# Patient Record
Sex: Male | Born: 1945 | Race: White | Hispanic: No | Marital: Married
Health system: Midwestern US, Community
[De-identification: ages and names within clinical notes are randomized; demographics above are authoritative.]

## PROBLEM LIST (undated history)

## (undated) DIAGNOSIS — Z87438 Personal history of other diseases of male genital organs: Secondary | ICD-10-CM

## (undated) DIAGNOSIS — I480 Paroxysmal atrial fibrillation: Secondary | ICD-10-CM

## (undated) DIAGNOSIS — Z950 Presence of cardiac pacemaker: Secondary | ICD-10-CM

## (undated) DIAGNOSIS — N529 Male erectile dysfunction, unspecified: Secondary | ICD-10-CM

## (undated) DIAGNOSIS — I10 Essential (primary) hypertension: Secondary | ICD-10-CM

## (undated) DIAGNOSIS — I2699 Other pulmonary embolism without acute cor pulmonale: Secondary | ICD-10-CM

## (undated) DIAGNOSIS — Z8669 Personal history of other diseases of the nervous system and sense organs: Secondary | ICD-10-CM

## (undated) DIAGNOSIS — C4490 Unspecified malignant neoplasm of skin, unspecified: Secondary | ICD-10-CM

## (undated) DIAGNOSIS — I4891 Unspecified atrial fibrillation: Secondary | ICD-10-CM

## (undated) DIAGNOSIS — I495 Sick sinus syndrome: Secondary | ICD-10-CM

## (undated) DIAGNOSIS — R7303 Prediabetes: Secondary | ICD-10-CM

## (undated) DIAGNOSIS — M1990 Unspecified osteoarthritis, unspecified site: Secondary | ICD-10-CM

## (undated) DIAGNOSIS — C801 Malignant (primary) neoplasm, unspecified: Secondary | ICD-10-CM

## (undated) DIAGNOSIS — G4730 Sleep apnea, unspecified: Secondary | ICD-10-CM

## (undated) DIAGNOSIS — E7800 Pure hypercholesterolemia, unspecified: Secondary | ICD-10-CM

## (undated) DIAGNOSIS — R0602 Shortness of breath: Secondary | ICD-10-CM

## (undated) DIAGNOSIS — I442 Atrioventricular block, complete: Secondary | ICD-10-CM

## (undated) DIAGNOSIS — H269 Unspecified cataract: Secondary | ICD-10-CM

## (undated) DIAGNOSIS — E785 Hyperlipidemia, unspecified: Secondary | ICD-10-CM

## (undated) DIAGNOSIS — Z8719 Personal history of other diseases of the digestive system: Secondary | ICD-10-CM

## (undated) HISTORY — PX: CATARACT EXTRACTION, BILATERAL: SHX1313

## (undated) HISTORY — PX: COSMETIC SURGERY: SHX468

## (undated) HISTORY — PX: HAND SURGERY: SHX662

## (undated) HISTORY — PX: PROSTATE SURGERY: SHX751

## (undated) HISTORY — PX: NASAL SEPTUM SURGERY: SHX37

## (undated) HISTORY — PX: SKIN CANCER EXCISION: SHX779

## (undated) HISTORY — PX: APPENDECTOMY: SHX54

## (undated) HISTORY — PX: SKIN GRAFT: SHX250

## (undated) HISTORY — PX: VASECTOMY: SHX75

## (undated) HISTORY — PX: HERNIA REPAIR: SHX51

## (undated) HISTORY — PX: PALATE SURGERY: SHX729

## (undated) HISTORY — PX: OSTEOTOMY AND ULNAR SHORTENING: SHX2140

## (undated) HISTORY — PX: KNEE SURGERY: SHX244

---

## 2000-06-09 DIAGNOSIS — I2699 Other pulmonary embolism without acute cor pulmonale: Secondary | ICD-10-CM

## 2000-06-09 HISTORY — DX: Other pulmonary embolism without acute cor pulmonale: I26.99

## 2005-03-20 NOTE — Op Note (Signed)
[facility]                                OPERATION REPORT                          SURGEON:  Alveria Apley, D.O.   [name] [name], [name] [name]   E:   MR  [id number]                         DATE:            03/20/2005   #:   [name]  [id number]                      PT. LOCATION:    [id number]   #   [name], D.O.   cc:    [name], D.O.   PREOPERATIVE DIAGNOSIS:   Tear of posterior horn medial meniscus, left knee.   POSTOPERATIVE DIAGNOSIS:   Same.   OPERATIVE PROCEDURE:   Partial medial meniscectomy, left knee.  CPT code 74259   SURGEON:[name], D.O.   ASSISTANT:   Hoffstaff, CSA   ANESTHESIA:   General.   COMPONENTS:   None.   COMPLICATIONS:   None.   EBL:   Less than 5 cc.   INDICATIONS:  This patient is a pleasant 59 year old male who has undergone   previous left knee arthroscopy and partial meniscectomy for a meniscal   tear.  He presents with a long standing history of left knee pain and was   found to have a second tear of his medial meniscus.  He tried numerous   conservative therapies to relieve the pain in his knee and unfortunately,   none of which provided lasting relief.  After the risks, benefits, and   possible complications of surgical intervention were reviewed, the patient   elected to proceed with partial medial meniscectomy.  It was also noted   that he sustained bilateral pulmonary embolism secondary to a bilateral   hernia repair. Therefore, the plan was to monitor the patient overnight and   start anticoagulation on him the first thing in the morning.   DESCRIPTION OF PROCEDURE:  The patient was seen and identified in the   preoperative holding area.  The procedure was reviewed and all questions   answered.  The left lower extremity was initialed and the patient received   a preoperative dose of IV antibiotics.  He was taken to the operating suite   where he underwent successful general endotracheal intubation by the    department of anesthesia.  The right leg was placed in a well padded well   leg holder while a tourniquet was applied to the left thigh.  The left   lower extremity was placed in the knee positioner and the left leg was then   sterilely prepped and draped in the routine fashion.  Using an Esmarch   bandage, the lower extremity was exsanguinated and the tourniquet inflated   to 350 mmHg.  Starting through the lateral incision, the arthroscope was   inserted and a diagnostic arthroscopy of the knee was performed.  A medial   incision was made through which the probe could be passed and the   patellofemoral articulation as well as the medial and lateral compartments   were examined.  The patellofemoral articulation was well  preserved and the   medial gutter showed no loose bodies.  The medial compartment showed a   small tear of posterior horn of medial meniscus which was mobile to probe   examination.  The anterior cruciate ligament was probe patent and stable.   Examination of the lateral compartment showed the meniscus to be probe   patent, stable, and with no evidence of significant cartilage damage.  The   lateral gutters showed no loose bodies.  The probe was then removed and the   arthroscopic shaver inserted.  The medial meniscal tear was trimmed to a   stable margin and reexamined with the probe to insure stability.  The   instruments were then removed and the knee was closed with 3-0 nylon in an   interrupted fashion.  Sterile dressings were applied to the knee and an Ace   bandage applied from mid shin to mid thigh.  The second thigh-high TED hose   was applied immediately after the surgery and the tourniquet was then   deflated.  The patient was then extubated and transported to the PACU in   stable and satisfactory condition.   Electronically Signed By:   Alveria Apley, D.O. 04/05/2005 20:17   _________________________________   Alveria Apley, D.O.   ecc  D:  03/22/2005  T:  03/23/2005  7:29 A    [id number]

## 2005-03-22 NOTE — Op Note (Signed)
[facility]                                OPERATION REPORT                          SURGEON:  Alveria Apley, D.O.   [name] [name], [name] [name]   E:   MR  [id number]                         DATE:            03/20/2005   #:   [name] [name]  [id number]                      PT. LOCATION:    [id number]   #   [name], D.O.   cc:    [name], D.O.   PREOPERATIVE DIAGNOSIS:   Tear of posterior horn medial meniscus, left knee.   POSTOPERATIVE DIAGNOSIS:   Same.   OPERATIVE PROCEDURE:   Partial medial meniscectomy, left knee.  CPT code 84132   SURGEON:[name], D.O.   ASSISTANT:   Hoffstaff, CSA   ANESTHESIA:   General.   COMPONENTS:   None.   COMPLICATIONS:   None.   EBL:   Less than 5 cc.   INDICATIONS:  This patient is a pleasant 59 year old male who has undergone   previous left knee arthroscopy and partial meniscectomy for a meniscal   tear.  He presents with a long standing history of left knee pain and was   found to have a second tear of his medial meniscus.  He tried numerous   conservative therapies to relieve the pain in his knee and unfortunately,   none of which provided lasting relief.  After the risks, benefits, and   possible complications of surgical intervention were reviewed, the patient   elected to proceed with partial medial meniscectomy.  It was also noted   that he sustained bilateral pulmonary embolism secondary to a bilateral   hernia repair. Therefore, the plan was to monitor the patient overnight and   start anticoagulation on him the first thing in the morning.   DESCRIPTION OF PROCEDURE:  The patient was seen and identified in the   preoperative holding area.  The procedure was reviewed and all questions   answered.  The left lower extremity was initialed and the patient received   a preoperative dose of IV antibiotics.  He was taken to the operating suite   where he underwent successful general endotracheal intubation by the   department of anesthesia.  The right leg was placed  in a well padded well   leg holder while a tourniquet was applied to the left thigh.  The left   lower extremity was placed in the knee positioner and the left leg was then   sterilely prepped and draped in the routine fashion.  Using an Esmarch   bandage, the lower extremity was exsanguinated and the tourniquet inflated   to 350 mmHg.  Starting through the lateral incision, the arthroscope was   inserted and a diagnostic arthroscopy of the knee was performed.  A medial   incision was made through which the probe could be passed and the   patellofemoral articulation as well as the medial and lateral compartments   were examined.  The patellofemoral articulation was well  preserved and the   medial gutter showed no loose bodies.  The medial compartment showed a   small tear of posterior horn of medial meniscus which was mobile to probe   examination.  The anterior cruciate ligament was probe patent and stable.   Examination of the lateral compartment showed the meniscus to be probe   patent, stable, and with no evidence of significant cartilage damage.  The   lateral gutters showed no loose bodies.  The probe was then removed and the   arthroscopic shaver inserted.  The medial meniscal tear was trimmed to a   stable margin and reexamined with the probe to insure stability.  The   instruments were then removed and the knee was closed with 3-0 nylon in an   interrupted fashion.  Sterile dressings were applied to the knee and an Ace   bandage applied from mid shin to mid thigh.  The second thigh-high TED hose   was applied immediately after the surgery and the tourniquet was then   deflated.  The patient was then extubated and transported to the PACU in   stable and satisfactory condition.   Electronically Signed By:   Alveria Apley, D.O. 04/05/2005 20:17   _________________________________   Alveria Apley, D.O.   ecc  D:  03/22/2005  T:  03/23/2005  7:29 A   [id number]

## 2007-09-06 NOTE — Procedures (Signed)
Test Reason : PRE OP   Blood Pressure : ***/*** mmHG   Vent. Rate : 068 BPM     Atrial Rate : 068 BPM   P-R Int : 156 ms          QRS Dur : 090 ms   QT Int : 388 ms       P-R-T Axes : 063 049 049 degrees   QTc Int : 412 ms   Normal sinus rhythm   Normal ECG   When compared with ECG of 16 Mar 2005   No significant change was found   Confirmed by Dewitt Rota, M.D., Ramanaiah ([id number]) on 09/07/2007 8:39:30 AM   Referred By:  Val Eagle           Overread By: Laurena Spies, M.D.

## 2007-09-06 NOTE — Procedures (Signed)
Test Reason : PRE OP   Blood Pressure : ***/*** mmHG   Vent. Rate : 068 BPM     Atrial Rate : 068 BPM   P-R Int : 156 ms          QRS Dur : 090 ms   QT Int : 388 ms       P-R-T Axes : 063 049 049 degrees   QTc Int : 412 ms   Normal sinus rhythm   Normal ECG   When compared with ECG of 16 Mar 2005   No significant change was found   Confirmed by Kakani, M.D., Ramanaiah ([id number]) on 09/07/2007 8:39:30 AM   Referred By:  DOCTOR UNASSIGN           Overread By: Ramanaiah Kakani, M.D.

## 2007-09-20 NOTE — Op Note (Signed)
[facility]                                OPERATION REPORT                        SURGEON:  JEFFREY Neoma Laming, M.D.   [facility] Venturino, Nilo   E:   MR  [id number]                DATE OF SURGERY:                     09/20/2007   #:   [name] [name]  [id number]             PT. LOCATION:                        OR  [id number]   #   JEFFREY Neoma Laming, M.D.      DOB: [birth year]        AGE:62        SEX:  M   cc:    Ocie Cornfield, M.D.          JEFFREY Neoma Laming, M.D.          Belinda Fisher, M.D.   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   OPERATIONS PERFORMED:   1. Nasal septoplasty.   2. Reinforcement of right mucoperichondrial flap with right inferior       turbinate mucosal graft.   3. Bilateral partial resection of inferior turbinates.   SURGEON:   Youlanda Roys, M.D.   SURGICAL ASSISTANT:   Gae Gallop, R.N.   ANESTHESIA:   General endotracheal anesthesia by Dr. Nestor Lewandowsky supplemented with topical   Afrin on cottonoids and 2% lidocaine with 1:100,000 epinephrine (8 cc)   ESTIMATED BLOOD LOSS:   Minimal   PACKING:   Bilateral Doyle splints, bilateral MeroGel, bilateral Bactroban ointment,   bilateral NuKnit Surgicel and bilateral Rapid Rhino packs.   COMPLICATIONS:   None   INDICATIONS FOR SURGERY: This is a pleasant 62 year old male who was   referred to [facility] for a   complaint of sleep apnea and nasal obstruction.  He has had problems for   over 6 years that have been getting worse since 2008.  He has daytime    fatigue and gasping in his sleep with witnessed apnea.  He only sleeps   about 3 hours at a time.  He had a sleep study done at [facility] on   06/12/2007 and was diagnosed with moderate sleep apnea.  He has been on   C-PAP for 6-7 years from a previous sleep study; records were not available   at the time of this dictation.  He has been on nasal steroids including   Nasonex and Veramyst for the past 6 years.  The sleep apnea is exacerbated   by nothing and it is improved by Afrin.  It is not improved  by allergy   medications, antihistamines, C-PAP for decongestant.  The patient does not   tolerate C-PAP.  He turns in his sleep and gets tangled up in the tubing.   The mask leaks with any movement and the mask causes red marks on his face   that can last for about a day.  He has also failed a trial of greater than   6 weeks of nasal steroids.  He has been advised not to use stimulants   before bed, advised to sleep on his side and has tried a weight-loss   program.  He is not a candidate for oral appliances and his total Epworth   sleepiness score is 18.   Pertinent physical examination showed the bony septum to be deviated to the   left impinging on the middle turbinate.  The cartilaginous septum was   deviated to the left and the right impinging on the inferior turbinate.   There was a large bone spur to the right anteriorly and one to the left   anteriorly.  The patient was advised of his findings and we recommended   therapy in the form of nasal septoplasty and reduction of his inferior   turbinates.  The patient was comfortable with that and requests that we   proceed.   NOTE #1:  Informed consent has been obtained from the patient.  We sat down   with him and gave him a booklet on nasal surgery and a diagram on the nasal   septum and inferior turbinates and reviewed with him the procedure for   septoplasty and inferior turbinate reduction.  We went through it in detail   and also reviewed with him the  representative risks and complications such   as bleeding, infection, nasal septal abscess, nasal septal perforation,   intranasal scarring, need for additional surgery, change in external   appearance of the nose, etc.  The patient states he understands that and   would like to proceed with the recommended surgery and has signed the   appropriate informed consents witnessed by the office secretarial staff.   He has also been told by [place] that no guarantees can be made as far as   surgical outcome and he signified that he also understood that.   NOTE #2:  Preoperative vital signs - blood pressure 160/89, pulse 62,   respirations 18, temperature 97.1, height 5 feet 11 inches tall, weight   90.7 kg.   NOTE #3:  Preoperative laboratory data - EKG showed normal sinus rhythm and   when compared to EKG of March 16, 2005, no change was noted.  Chest x-ray   was within normal limits.  Basic metabolic panel showed a glucose of 68   which is slightly low below the low normal of 74.  The rest of the   metabolic panel was normal.  CBC was within normal limits.  Urinalysis was   completely within normal limits.   PROCEDURE:  The patient was brought to the operating room and placed in the   supine position.  After satisfactory general anesthesia was established via   oral endotracheal intubation, the patient given 1 gram of Kefzol   intravenously 12 mg of Decadron intravenously.  He then had the vibrissae   of his nose clipped with iris scissors.  The nose was then lightly packed   with topical Afrin on cottonoids.  After satisfactory mucosal   vasoconstriction, the cottonoids were removed and the nasal septum and  inferior turbinates were infiltrated with a total of 8 cc of 2% lidocaine   with 1:100,000 epinephrine.  The patient was then prepped and draped in a   sterile manner for intranasal surgery.  The surgery was begun by making a   hemitransfixion incision on the left side of the nasal septum with a Beaver   64 blade.   Bilateral mucoperichondrial flaps were developed with Glorious Peach and   Health and safety inspector.  Great care was taken to elevate the   flaps over the bilateral maxillary crestal bone spurs, especially the large   one on the right.  Once this was accomplished, the septum was isolated with   a caudal speculum and it was incised off the maxillary crest with angled   Knight scissors and disarticulated from the vomerine plate with a Cytogeneticist.  The vomerine plate was taken out in a piecemeal fashion under   careful direct vision with Lenoria Chime forceps including a part of the curved   posterior maxillary crest.  Several pieces of vomerine plate were trimmed,   morselized and reinserted into the posterior mucoperichondrial envelope for   soft tissue support.  Once the mucoperiosteum and mucoperichondrial were   reflected off the maxillary crestal spurs anteriorly, they were excised   with the Jansen-Middleton double-action rongeurs.  This still left a large   bone spur anteriorly at the very tip of the septum with a markedly deformed   anterior nasal spine.  This was removed with an osteotome and mallet.  This   gave a nice straight maxillary crest from anterior to posterior, but it was   noted that the right mucoperichondrial flap in the center was markedly   atrophic and thinned out from the bone spur.  At this point, the   mucoperichondrial envelope was suctioned free of any blood or debris.  A   pocket was created in the columella of the nose with skin hooks and curved   Frohman cartilage scissors.  The right inferior turbinate was in-fractured.   The inferior half was crossclamped with a straight vascular clamp and   resected with turbinate scissors.  The mucosa of the partially resected   right inferior turbinate was filleted open, the bone removed, the graft   placed under the mucoperichondrium on the right side and sutured in place   with multiple interrupted 4-0 chromic sutures.  Then, the  cartilaginous   septum was advanced into the new columellar pocket that was made with skin   hooks and curved Frohman cartilage scissors.  This gave good tip support.   Then, the caudal incision was closed with multiple interrupted 4-0 chromic   sutures.  The caudal septum was stabilized in its new columellar pocket   with horizontal mattress sutures of 3-0 chromic.  Then, using the ENTrigue   surgical septal stapler, multiple disposable septal staples were applied to   secure the combination of both mucoperichondrial flaps of the septum with   the middle turbinate graft in a sandwich effect.  Then, two pieces of   MeroGel placed in each side of the nasal septum followed by Bactroban   coated Doyle splints secured with a horizontal mattress suture of 2-0   nylon.  After this, the left inferior turbinate was partially resected as   we did on the right.  Then both resection lines were cauterized with   suction cautery and overlaid with a piece of NuKnit Surgicel.  We then  placed standard nasal Rhino packs followed by sterile cotton followed by a   nasal drip pad.  This completed the procedure on the patient.  He was   returned to anesthesia where he was awakened from his anesthetic, extubated   and taken to the recovery room in excellent condition.  Blood loss on the   case was minimal and there were no complications from his anesthetic or   surgery, he tolerated both quite well.   The wife was informed of the operative findings.  I especially stressed to   her the very thin mucoperichondrial flap and the possibility of perforation   in that area and the need to put a mucosal graft there to strengthen the   flap.  She says she understood all of that.  We have given her   postoperative instructions for her husband on septoplasty and turbinate   reduction as well as three prescriptions; Levaquin 500 mg a day x 10 days,   Percocet 5/325 one every 4 hours needed for pain and Phenergan 25 mg   tablets one every 6 hours  as needed for postop nausea.  I have asked her to   bring her husband to our [facility] office tomorrow, Wednesday, Sep 21, 2007, at 08:30 in the morning to have the first postoperative checkup.   Electronically Signed By:   Sandi Carne, M.D. 10/03/2007 08:58   ____________________________   Sandi Carne, M.D.   Seleta Rhymes  D:  09/20/2007  T:  09/21/2007 11:44 A  [id number]

## 2007-09-20 NOTE — Op Note (Signed)
[facility]                                OPERATION REPORT                        SURGEON:  JEFFREY Neoma Laming, M.D.   [facility], [place]   E:   MR  [id number]                DATE OF SURGERY:                     09/20/2007   #:   [name]  [id number]             PT. LOCATION:                        OR  OR25   #   JEFFREY Neoma Laming, M.D.      DOB: December 19, 1945        AGE:62        SEX:  M   cc:    Ocie Cornfield, M.D.          JEFFREY Neoma Laming, M.D.          Belinda Fisher, M.D.   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   OPERATIONS PERFORMED:   1. Nasal septoplasty.   2. Reinforcement of right mucoperichondrial flap with right inferior       turbinate mucosal graft.   3. Bilateral partial resection of inferior turbinates.   SURGEON:   Youlanda Roys, M.D.   SURGICAL ASSISTANT:   Gae Gallop, R.N.   ANESTHESIA:   General endotracheal anesthesia by Dr. Nestor Lewandowsky supplemented with topical   Afrin on cottonoids and 2% lidocaine with 1:100,000 epinephrine (8 cc)   ESTIMATED BLOOD LOSS:   Minimal   PACKING:   Bilateral Doyle splints, bilateral MeroGel, bilateral Bactroban ointment,   bilateral NuKnit Surgicel and bilateral Rapid Rhino packs.   COMPLICATIONS:   None   INDICATIONS FOR SURGERY: This is a pleasant 62 year old male who was   referred to [facility] for a   complaint of sleep apnea and nasal obstruction.  He has had problems for   over 6 years that have been getting worse since 2008.  He has daytime    fatigue and gasping in his sleep with witnessed apnea.  He only sleeps   about 3 hours at a time.  He had a sleep study done at [facility] on   06/12/2007 and was diagnosed with moderate sleep apnea.  He has been on   C-PAP for 6-7 years from a previous sleep study; records were not available   at the time of this dictation.  He has been on nasal steroids including   Nasonex and Veramyst for the past 6 years.  The sleep apnea is exacerbated   by nothing and it is improved by Afrin.  It is not improved  by allergy   medications, antihistamines, C-PAP for decongestant.  The patient does not   tolerate C-PAP.  He turns in his sleep and gets tangled up in the tubing.   The mask leaks with any movement and the mask causes red marks on his face   that can last for about a day.  He has also failed a trial of greater than   6 weeks of nasal steroids.  He has been advised not to use stimulants   before bed, advised to sleep on his side and has tried a weight-loss   program.  He is not a candidate for oral appliances and his total Epworth   sleepiness score is 18.   Pertinent physical examination showed the bony septum to be deviated to the   left impinging on the middle turbinate.  The cartilaginous septum was   deviated to the left and the right impinging on the inferior turbinate.   There was a large bone spur to the right anteriorly and one to the left   anteriorly.  The patient was advised of his findings and we recommended   therapy in the form of nasal septoplasty and reduction of his inferior   turbinates.  The patient was comfortable with that and requests that we   proceed.   NOTE #1:  Informed consent has been obtained from the patient.  We sat down   with him and gave him a booklet on nasal surgery and a diagram on the nasal   septum and inferior turbinates and reviewed with him the procedure for   septoplasty and inferior turbinate reduction.  We went through it in detail    and also reviewed with him the representative risks and complications such   as bleeding, infection, nasal septal abscess, nasal septal perforation,   intranasal scarring, need for additional surgery, change in external   appearance of the nose, etc.  The patient states he understands that and   would like to proceed with the recommended surgery and has signed the   appropriate informed consents witnessed by the office secretarial staff.   He has also been told by [place] that no guarantees can be made as far as   surgical outcome and he signified that he also understood that.   NOTE #2:  Preoperative vital signs - blood pressure 160/89, pulse 62,   respirations 18, temperature 97.1, height 5 feet 11 inches tall, weight   90.7 kg.   NOTE #3:  Preoperative laboratory data - EKG showed normal sinus rhythm and   when compared to EKG of March 16, 2005, no change was noted.  Chest x-ray   was within normal limits.  Basic metabolic panel showed a glucose of 68   which is slightly low below the low normal of 74.  The rest of the   metabolic panel was normal.  CBC was within normal limits.  Urinalysis was   completely within normal limits.   PROCEDURE:  The patient was brought to the operating room and placed in the   supine position.  After satisfactory general anesthesia was established via   oral endotracheal intubation, the patient given 1 gram of Kefzol   intravenously 12 mg of Decadron intravenously.  He then had the vibrissae   of his nose clipped with iris scissors.  The nose was then lightly packed   with topical Afrin on cottonoids.  After satisfactory mucosal   vasoconstriction, the cottonoids were removed and the nasal septum and  inferior turbinates were infiltrated with a total of 8 cc of 2% lidocaine   with 1:100,000 epinephrine.  The patient was then prepped and draped in a   sterile manner for intranasal surgery.  The surgery was begun by making a    hemitransfixion incision on the left side of the nasal septum with a Beaver   64 blade.  Bilateral mucoperichondrial flaps were developed with Glorious Peach and   Health and safety inspector.  Great care was taken to elevate the   flaps over the bilateral maxillary crestal bone spurs, especially the large   one on the right.  Once this was accomplished, the septum was isolated with   a caudal speculum and it was incised off the maxillary crest with angled   Knight scissors and disarticulated from the vomerine plate with a Cytogeneticist.  The vomerine plate was taken out in a piecemeal fashion under   careful direct vision with Lenoria Chime forceps including a part of the curved   posterior maxillary crest.  Several pieces of vomerine plate were trimmed,   morselized and reinserted into the posterior mucoperichondrial envelope for   soft tissue support.  Once the mucoperiosteum and mucoperichondrial were   reflected off the maxillary crestal spurs anteriorly, they were excised   with the Jansen-Middleton double-action rongeurs.  This still left a large   bone spur anteriorly at the very tip of the septum with a markedly deformed   anterior nasal spine.  This was removed with an osteotome and mallet.  This   gave a nice straight maxillary crest from anterior to posterior, but it was   noted that the right mucoperichondrial flap in the center was markedly   atrophic and thinned out from the bone spur.  At this point, the   mucoperichondrial envelope was suctioned free of any blood or debris.  A   pocket was created in the columella of the nose with skin hooks and curved   Frohman cartilage scissors.  The right inferior turbinate was in-fractured.   The inferior half was crossclamped with a straight vascular clamp and   resected with turbinate scissors.  The mucosa of the partially resected   right inferior turbinate was filleted open, the bone removed, the graft    placed under the mucoperichondrium on the right side and sutured in place   with multiple interrupted 4-0 chromic sutures.  Then, the cartilaginous   septum was advanced into the new columellar pocket that was made with skin   hooks and curved Frohman cartilage scissors.  This gave good tip support.   Then, the caudal incision was closed with multiple interrupted 4-0 chromic   sutures.  The caudal septum was stabilized in its new columellar pocket   with horizontal mattress sutures of 3-0 chromic.  Then, using the ENTrigue   surgical septal stapler, multiple disposable septal staples were applied to   secure the combination of both mucoperichondrial flaps of the septum with   the middle turbinate graft in a sandwich effect.  Then, two pieces of   MeroGel placed in each side of the nasal septum followed by Bactroban   coated Doyle splints secured with a horizontal mattress suture of 2-0   nylon.  After this, the left inferior turbinate was partially resected as   we did on the right.  Then both resection lines were cauterized with   suction cautery and overlaid with a piece of NuKnit Surgicel.  We then  placed standard nasal Rhino packs followed by sterile cotton followed by a   nasal drip pad.  This completed the procedure on the patient.  He was   returned to anesthesia where he was awakened from his anesthetic, extubated   and taken to the recovery room in excellent condition.  Blood loss on the   case was minimal and there were no complications from his anesthetic or   surgery, he tolerated both quite well.   The wife was informed of the operative findings.  I especially stressed to   her the very thin mucoperichondrial flap and the possibility of perforation   in that area and the need to put a mucosal graft there to strengthen the   flap.  She says she understood all of that.  We have given her   postoperative instructions for her husband on septoplasty and turbinate    reduction as well as three prescriptions; Levaquin 500 mg a day x 10 days,   Percocet 5/325 one every 4 hours needed for pain and Phenergan 25 mg   tablets one every 6 hours as needed for postop nausea.  I have asked her to   bring her husband to our [facility] office tomorrow, Wednesday, Sep 21, 2007, at 08:30 in the morning to have the first postoperative checkup.   Electronically Signed By:   Sandi Carne, M.D. 10/03/2007 08:58   ____________________________   Sandi Carne, M.D.   Seleta Rhymes  D:  09/20/2007  T:  09/21/2007 11:44 A  [id number]

## 2007-11-28 NOTE — Op Note (Signed)
[facility]                                OPERATION REPORT                        SURGEON:  JEFFREY Neoma Laming, M.D.   [facility] [name], [name]   E:   MR  [id number]                DATE OF SURGERY:                     11/28/2007   #:   [name] [name]  [id number]             PT. LOCATION:                        OR  OR36   #   JEFFREY Neoma Laming, M.D.      DOB: [birth year]        AGE:62        SEX:  M   cc:    [name] [name], M.D.          JEFFREY Neoma Laming, M.D.   (2 copies Dr. Lowell Guitar)   PREOPERATIVE DIAGNOSIS   Obstructive sleep apnea with failed nasal C-PAP   POSTOPERATIVE DIAGNOSIS   Obstructive sleep apnea with failed nasal C-PAP - pathology pending.   OPERATION PERFORMED   Bilateral adult tonsillectomy and uvulopharyngpalatoplasty.   SURGEON   Dr. Lowell Guitar   SURGEON   Mrs. Gae Gallop, RN   ANESTHESIA   General endotracheal anesthesia, Dr. Danae Orleans supplemented with 0.5% Marcaine   with 1:200,000 epinephrine (8 mL).   ESTIMATED BLOOD LOSS   Minimal   PACKING   None   COMPLICATIONS   None   INDICATION FOR PROCEDURE   This is a pleasant 62 year old male who was diagnosed with obstructive   sleep apnea.  He has had it for over 6 years.  The course has been getting   progressively worse since 2008.  The symptoms have been associated with   daytime fatigue, gasping in his sleep and witnessed apnea.  He sleeps about   3 hours at a time.  He had [facility] on June 12, 2007.  Sleep apnea was described as   moderate.  He had other sleep studies in the past, records were not   available at the time of this dictation, but he has been using C-PAP for   6-7 years with progressive difficulty.  He has also been on Nasonex and   Veramyst for the past 6 years.  The sleep apnea is exacerbated by nothing.   It is improved by Afrin, but not improved by allergy medications,   antihistamines, C-PAP or decongestants.  The patient does not tolerate the   C-PAP.  He turns in his  sleeping and gets tangled up in the tubing.  The   mask leaks without any movement and the mask causes red marks on his face   that can last for most of the day.  The patient has failed a trial of   greater than 6 weeks of nasal steroids.  He has been advised not to use   stimulants before bed and had been advised to sleep on his side and has   tried  a weight-loss program.  He is not a candidate for oral appliances and   his Epworth sleepiness score is 18.  He had undergone a septoplasty and   turbinate reduction to correct the nasal part of his obstructive sleep   apnea on Sep 20, 2007.  He has healed well from that and he is now scheduled   for a palatoplasty and tonsillectomy.   Note #1.  Informed consents been obtained from the patient.  We sat down   with him and discussed in detail the diagnosis of sleep apnea and reviewed   the procedure for tonsillectomy and palatoplasty.  We also reviewed the   risks and complications such as bleeding, infection, nasopharyngeal   stenosis, nasopharyngeal regurgitation on a temporary or permanent nature,   persistent or chronic pharyngeal dehydration, chronic sore throat,   difficulty eating and drinking, persistent snoring and apnea, need for   additional surgery and possible scarring of the palate.  The patient states   he understands all of this, he understands the indications for the surgery   and the procedure and the representative risks and complications.  He also   understands that no guarantees have been made him as far as surgical   outcome.  He would like to proceed with the surgery and has signed   appropriate informed consents witnessed by the office secretarial staff.   Note #2.  Preoperative vital signs - blood pressure 143/86, pulse 66,   respirations 20, temperature 97.2, height 5 feet 11 inches tall, weight   98.3 kg.   Note #3.  Preoperative laboratory data - EKG shows normal sinus rhythm.   When compared with EKG of February 27, 2005 no significant change.   Chest   x-ray was within normal limits.  Basic metabolic panel was normal.  CBC was   normal.  Urinalysis was completely normal.   PROCEDURE   The patient was brought to the operating room, placed in the supine   position and after satisfactory general anesthesia was established via oral   endotracheal intubation, the patient was given a gram of Kefzol   intravenously and 12 mg of Decadron intravenously.  He does have allergies   to penicillin and morphine, but according to his wife the morphine allergy   is really not a true allergy, but a funny feeling when he gets morphine.   He then had the table turned 90 degrees counterclockwise.  He was placed in   a slight Trendelenburg position after appropriate prepping and draping, we   examined the nose with a headlight and nasal speculum and cleaned out some   normal postoperative crusting from the posterior third of each inferior   turbinate surgical resection site.  The nose then irrigated gentle with   sterile saline.  With this complete we then covered the head and nose and   sterile drapes and then opened up the mouth and placed the Crowe-Davis   mouth gag distracting the mandible and tongue.  This secured the   endotracheal tube and then the mouth gag was connected to the Mayo stand   via the Dedo extension apparatus.  After this each tonsil was grabbed with   a tonsil tenaculum, retracted toward the midline and excised out of the   tonsillar fossa with the coblation probe on a setting of 6/4.  Any spot   bleeding in each tonsillar fossa was controlled with a coblation setting of   4.  After this uvula was grabbed  with an Allis clamp, retracted anteriorly.   The muscular line of the soft palate was identified.  Then using the   coblation probe the uvula and portion of soft palate were resected to the   appropriate level.  Bleeding was controlled with the coblation aspect of   the wand.  Then using multiple 3-0 Vicryl sutures the anterior and   posterior pillars of  each tonsillar fossae were approximated in a smooth   mucosa to mucosa edge closure and then the nasopharyngeal side of the   palate was approximated to the oropharyngeal side of the soft palate, again   in a smooth mucosa to mucosa edge closure.  This gave the patient a good   nasopharyngeal port that was wide open and shorter than the original.  The   area was then irrigated with sterile saline and suctioned clear.  No active   bleeding was noted and then bilateral greater palatine nerve blocks were   administered and infiltration of the incision line was also administered   utilizing a total of 8 mL of 0.5% Marcaine with 1:200,000 epinephrine.  The   patient then again had the mouth irrigated with sterile saline and   suctioned clear.  No bleeding noted.  He then had the Crowe-Davis mouth gag   released and removed, he was undraped, placed back in the supine position   and returned to anesthesia where he was awakened from his anesthetic,   extubated, and taken to the recovery room in excellent condition.  Blood   loss on the case was minimal and there were no complications from his   anesthetic or surgery.  He tolerated both quite well.  The patient's wife   was informed of the operative findings.  The postop instructions reviewed   with her in detail.  The patient was issued two prescriptions.  Since he is   allergic to penicillin he is given Septra suspension 2 teaspoons b.i.d. for   10 days and for pain, Lortab elixir 3 teaspoons q. 4 h for postop   discomfort.  I have asked the wife to have him make his first postoperative   checkup in our office in 1 week.   Electronically Signed By:   Sandi Carne, M.D. 12/01/2007 09:26   ____________________________   Tinnie Gens Neoma Laming, M.D.   MH  D:  11/28/2007  T:  11/29/2007  1:22 P  [id number]

## 2007-11-28 NOTE — Op Note (Signed)
[facility]                                OPERATION REPORT                        SURGEON:  JEFFREY Neoma [name], M.D.   [facility] [place], [place]   E:   MR  [id number]                DATE OF SURGERY:                     11/28/2007   #:   [name] [name]  [id number]             PT. LOCATION:                        OR  OR36   #   JEFFREY Neoma [name], M.D.      DOB: [birth year]        AGE:62        SEX:  M   cc:    Ocie Cornfield, M.D.          JEFFREY Neoma [name], M.D.   (2 copies Dr. Lowell Guitar)   PREOPERATIVE DIAGNOSIS   Obstructive sleep apnea with failed nasal C-PAP   POSTOPERATIVE DIAGNOSIS   Obstructive sleep apnea with failed nasal C-PAP - pathology pending.   OPERATION PERFORMED   Bilateral adult tonsillectomy and uvulopharyngpalatoplasty.   SURGEON   Dr. Lowell Guitar   SURGEON   Mrs. Gae Gallop, RN   ANESTHESIA   General endotracheal anesthesia, Dr. Danae Orleans supplemented with 0.5% Marcaine   with 1:200,000 epinephrine (8 mL).   ESTIMATED BLOOD LOSS   Minimal   PACKING   None   COMPLICATIONS   None   INDICATION FOR PROCEDURE   This is a pleasant 62 year old male who was diagnosed with obstructive   sleep apnea.  He has had it for over 6 years.  The course has been getting   progressively worse since 2008.  The symptoms have been associated with   daytime fatigue, gasping in his sleep and witnessed apnea.  He sleeps about   3 hours at a time.  He had a sleep study done at [facility] on June 12, 2007.  Sleep apnea was described as   moderate.  He had other sleep studies in the past, records were not   available at the time of this dictation, but he has been using C-PAP for   6-7 years with progressive difficulty.  He has also been on Nasonex and   Veramyst for the past 6 years.  The sleep apnea is exacerbated by nothing.   It is improved by Afrin, but not improved by allergy medications,   antihistamines, C-PAP or decongestants.  The patient does not tolerate the    C-PAP.  He turns in his sleeping and gets tangled up in the tubing.  The   mask leaks without any movement and the mask causes red marks on his face   that can last for most of the day.  The patient has failed a trial of   greater than 6 weeks of nasal steroids.  He has been advised not to use   stimulants before bed and had been advised to sleep on his side and has   tried  a weight-loss program.  He is not a candidate for oral appliances and   his Epworth sleepiness score is 18.  He had undergone a septoplasty and   turbinate reduction to correct the nasal part of his obstructive sleep   apnea on Sep 20, 2007.  He has healed well from that and he is now scheduled   for a palatoplasty and tonsillectomy.   Note #1.  Informed consents been obtained from the patient.  We sat down   with him and discussed in detail the diagnosis of sleep apnea and reviewed   the procedure for tonsillectomy and palatoplasty.  We also reviewed the   risks and complications such as bleeding, infection, nasopharyngeal   stenosis, nasopharyngeal regurgitation on a temporary or permanent nature,   persistent or chronic pharyngeal dehydration, chronic sore throat,   difficulty eating and drinking, persistent snoring and apnea, need for   additional surgery and possible scarring of the palate.  The patient states   he understands all of this, he understands the indications for the surgery   and the procedure and the representative risks and complications.  He also   understands that no guarantees have been made him as far as surgical   outcome.  He would like to proceed with the surgery and has signed   appropriate informed consents witnessed by the office secretarial staff.   Note #2.  Preoperative vital signs - blood pressure 143/86, pulse 66,   respirations 20, temperature 97.2, height 5 feet 11 inches tall, weight   98.3 kg.   Note #3.  Preoperative laboratory data - EKG shows normal sinus rhythm.    When compared with EKG of February 27, 2005 no significant change.  Chest   x-ray was within normal limits.  Basic metabolic panel was normal.  CBC was   normal.  Urinalysis was completely normal.   PROCEDURE   The patient was brought to the operating room, placed in the supine   position and after satisfactory general anesthesia was established via oral   endotracheal intubation, the patient was given a gram of Kefzol   intravenously and 12 mg of Decadron intravenously.  He does have allergies   to penicillin and morphine, but according to his wife the morphine allergy   is really not a true allergy, but a funny feeling when he gets morphine.   He then had the table turned 90 degrees counterclockwise.  He was placed in   a slight Trendelenburg position after appropriate prepping and draping, we   examined the nose with a headlight and nasal speculum and cleaned out some   normal postoperative crusting from the posterior third of each inferior   turbinate surgical resection site.  The nose then irrigated gentle with   sterile saline.  With this complete we then covered the head and nose and   sterile drapes and then opened up the mouth and placed the Crowe-Davis   mouth gag distracting the mandible and tongue.  This secured the   endotracheal tube and then the mouth gag was connected to the Mayo stand   via the Dedo extension apparatus.  After this each tonsil was grabbed with   a tonsil tenaculum, retracted toward the midline and excised out of the   tonsillar fossa with the coblation probe on a setting of 6/4.  Any spot   bleeding in each tonsillar fossa was controlled with a coblation setting of   4.  After this uvula was grabbed  with an Allis clamp, retracted anteriorly.   The muscular line of the soft palate was identified.  Then using the   coblation probe the uvula and portion of soft palate were resected to the   appropriate level.  Bleeding was controlled with the coblation aspect of    the wand.  Then using multiple 3-0 Vicryl sutures the anterior and   posterior pillars of each tonsillar fossae were approximated in a smooth   mucosa to mucosa edge closure and then the nasopharyngeal side of the   palate was approximated to the oropharyngeal side of the soft palate, again   in a smooth mucosa to mucosa edge closure.  This gave the patient a good   nasopharyngeal port that was wide open and shorter than the original.  The   area was then irrigated with sterile saline and suctioned clear.  No active   bleeding was noted and then bilateral greater palatine nerve blocks were   administered and infiltration of the incision line was also administered   utilizing a total of 8 mL of 0.5% Marcaine with 1:200,000 epinephrine.  The   patient then again had the mouth irrigated with sterile saline and   suctioned clear.  No bleeding noted.  He then had the Crowe-Davis mouth gag   released and removed, he was undraped, placed back in the supine position   and returned to anesthesia where he was awakened from his anesthetic,   extubated, and taken to the recovery room in excellent condition.  Blood   loss on the case was minimal and there were no complications from his   anesthetic or surgery.  He tolerated both quite well.  The patient's wife   was informed of the operative findings.  The postop instructions reviewed   with her in detail.  The patient was issued two prescriptions.  Since he is   allergic to penicillin he is given Septra suspension 2 teaspoons b.i.d. for   10 days and for pain, Lortab elixir 3 teaspoons q. 4 h for postop   discomfort.  I have asked the wife to have him make his first postoperative   checkup in our office in 1 week.   Electronically Signed By:   Sandi Carne, M.D. 12/01/2007 09:26   ____________________________   [name] [name], M.D.   [facility]  D:  11/28/2007  T:  11/29/2007  1:22 P  [id number]

## 2011-06-24 DIAGNOSIS — N400 Enlarged prostate without lower urinary tract symptoms: Secondary | ICD-10-CM | POA: Insufficient documentation

## 2011-08-13 HISTORY — PX: CARDIAC CATHETERIZATION: SHX172

## 2012-05-18 HISTORY — PX: COLONOSCOPY: SHX174

## 2012-10-25 DIAGNOSIS — N529 Male erectile dysfunction, unspecified: Secondary | ICD-10-CM | POA: Insufficient documentation

## 2013-06-05 LAB — CBC WITH AUTOMATED DIFF
BASOPHILS: 1.1 % (ref 0–3)
EOSINOPHILS: 2.8 % (ref 0–5)
HCT: 42.2 % (ref 37.0–50.0)
HGB: 14.1 gm/dl (ref 12.4–17.2)
IMMATURE GRANULOCYTES: 0.2 % (ref 0.0–3.0)
LYMPHOCYTES: 45.1 % (ref 28–48)
MCH: 30.1 pg (ref 23.0–34.6)
MCHC: 33.4 gm/dl (ref 30.0–36.0)
MCV: 90.2 fL (ref 80.0–98.0)
MONOCYTES: 10.5 % (ref 1–13)
MPV: 9.4 fL (ref 6.0–10.0)
NEUTROPHILS: 40.3 % (ref 34–64)
NRBC: 0 (ref 0–0)
PLATELET: 288 10*3/uL (ref 140–450)
RBC: 4.68 M/uL (ref 3.80–5.70)
RDW-SD: 44.5 — ABNORMAL HIGH (ref 35.1–43.9)
WBC: 5.6 10*3/uL (ref 4.0–11.0)

## 2013-06-05 LAB — URINALYSIS W/ RFLX MICROSCOPIC
Bilirubin: NEGATIVE
Blood: NEGATIVE
Glucose: NEGATIVE mg/dl
Ketone: NEGATIVE mg/dl
Leukocyte Esterase: NEGATIVE
Nitrites: NEGATIVE
Protein: NEGATIVE mg/dl
Specific gravity: <=1.005 (ref 1.005–1.030)
Urobilinogen: 0.2 mg/dl (ref 0.0–1.0)
pH (UA): 6.5 (ref 5.0–9.0)

## 2013-06-05 LAB — POC URINE MICROSCOPIC

## 2013-06-05 LAB — METABOLIC PANEL, BASIC
BUN: 13 mg/dl (ref 7–25)
CO2: 30 mEq/L (ref 21–32)
Calcium: 9.2 mg/dl (ref 8.5–10.1)
Chloride: 104 mEq/L (ref 98–107)
Creatinine: 0.9 mg/dl (ref 0.6–1.3)
GFR est AA: >60
GFR est non-AA: >60
Glucose: 97 mg/dl (ref 74–106)
Potassium: 3.8 mEq/L (ref 3.5–5.1)
Sodium: 140 mEq/L (ref 136–145)

## 2013-07-04 NOTE — Op Note (Signed)
[facility]  Operation Report  NAME:  [name] [name], [name] [name]  SEX:   M  DATE: 07/04/2013  DOB: [birth year]  MR#    [id number]  ROOM:  [id number]  ACCT#  [id number]        LOCATION:  Main operating room at [facility].    PREOPERATIVE DIAGNOSES:  1.  Right wrist ulnar lunate abutment.  2.  Suspect right wrist triangular fibrocartilage complex tear.  3.  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     POSTOPERATIVE DIAGNOSES:  1. Right wrist ulnar lunate abutment.  2.   Suspect right wrist triangular fibrocartilage complex tear.  3.  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   4.  A central triangular fibrocartilage complex tear.    SURGICAL PROCEDURES PERFORMED:  1.  Injection right thumb carpometacarpal joint with 2% Xylocaine plain and   Celestone (0.5 mL each).  2.  Right wrist arthroscopy including joint debridement and triangular   fibrocartilage complex  debridement.    SURGICAL ASSISTANT:  Jayme Stone, CSA.    ANESTHESIA:  Supraclavicular arm block for postoperative pain relief, general anesthetic   with LMA for intraoperative anesthesia.    FLUIDS DELIVERED INTRAOPERATIVELY:  1000 mL of Ringer's lactate.    ESTIMATED BLOOD LOSS:  Minimal.    TOTAL TOURNIQUET TIME FOR THE CASE:  Zero.    COMPLICATIONS:  None.    INDICATIONS:  The patient is a 68 year old right-hand dominant white male who has severe   right wrist and thumb pain.  His preoperative radiographs and MRI demonstrate   ulnar lunate abutment with a suspected TFCC tear as well as severe end-stage   thumb CMC joint DJD.  He is being taken to the operating room to undergo right   wrist arthroscopy and debridement of the joint as indicated, possible   debridement and/or repair the TFCC possible ulnar shortening osteotomy and   then #2 is inject right thumb CMC joint with 0.5 mL each of 2% Xylocaine plain   and Celestone.    DESCRIPTION OF PROCEDURE:  The patient was taken to the operating room and  placed on the OR table in the   supine position.  He is placed under general anesthetic with an LMA.    Immediately prior to entry into the operating room suite, a supraclavicular   arm block was placed to provide for postoperative pain relief.  The right   upper extremity was prepped and draped in the usual fashion for surgery.    Under sterile prep an injection is placed into the right thumb CMC joint with   0.5 mL each of 2% Xylocaine plain and Celestone. Then the right upper   extremity is placed into the Linvatec wrist traction tower.  A sterile   tourniquet is applied.  Distractive traction is applied 10 pounds and the   radiocarpal joint is injected with 10 mL of 2% Xylocaine with 1:100,000   epinephrine. The anticipated incisional portals were also injected at the 3,   4, 5, and R6 portals with the same fluid. Again this is after the formal prep   and drape. The tourniquet was not inflated.  Two incisions were made in the   wrist.  One is at the 3-4 interval. The other is at the R6 interval. The scope   is placed into the 3-4 interval and sucker shaver is placed into the R6   interval.  There is complete loss of articular cartilage at the proximal pole  of the lunate on its ulnar 1/2 and accompanying that in an opposed position   the entire head of the lunate was also denuded of articular cartilage.  The   TFCC has a tear  that starts at the mid substance of the mid portion of the   sigmoid notch of the distal radius and proceeds ulnad and palmar. The sucker   shaver is used to debride the TFCC and the other nonviable tissues in the   wrist and then instrumentation was removed.  Pictures are taken to demonstrate   the condition of the wrist. The right distal radioulnar joint is stable to   anterior, posterior stress testing.  The wounds were closed with 4-0 nylon   suture using multiple interrupted horizontal mattress suture technique.    Xeroform and sterile gauze dressing is applied followed by  application of a   volar plaster wrist splint held in place with an Ace wrap.  The patient is   returned to the recovery area in good condition.  There were no pre-, intra-   or postoperative complications.      ___________________  Jolyn NapGordon J Nyeli Holtmeyer MD  Dictated By:.   [facility]  D:07/04/2013 13:33:27  T: 07/04/2013 14:45:51  [id number]  Authenticated by Sharee PimpleGordon J. Richardo PriestIiams, M.D. On 07/07/2013 11:48:56 AM

## 2013-07-04 NOTE — Op Note (Signed)
[facility]  [facility]  NAME:  [name], [name]  SEX:   M  DATE: 07/04/2013  DOB: Oct 03, 1945  MR#    [id number]  ROOM:  [id number]  ACCT#  [id number]        LOC[facility]:  [facility]    PREOPERATIVE DIAGNOSES:  1.  Right wrist ulnar lunate abutment.  2.  Suspect right wrist triangular fibrocartilage complex tear.  3.  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     POSTOPERATIVE DIAGNOSES:  1. Right wrist ulnar lunate abutment.  2.   Suspect right wrist triangular fibrocartilage complex tear.  3.  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   4.  A central triangular fibrocartilage complex tear.    SURGICAL PROCEDURES PERFORMED:  1.  Injection right thumb carpometacarpal joint with 2% Xylocaine plain and   Celestone (0.5 mL each).  2.  Right wrist arthroscopy including joint debridement and triangular   fibrocartilage complex  debridement.    SURGICAL ASSISTANT:  Jayme Stone, CSA.    ANESTHESIA:  Supraclavicular arm block for postoperative pain relief, general anesthetic   with LMA for intraoperative anesthesia.    FLUIDS DELIVERED INTRAOPERATIVELY:  1000 mL of Ringer's lactate.    ESTIMATED BLOOD LOSS:  Minimal.    TOTAL TOURNIQUET TIME FOR THE CASE:  Zero.    COMPLICATIONS:  None.    INDICATIONS:  The patient is a 68 year old right-hand dominant white male who has severe   right wrist and thumb pain.  His preoperative radiographs and MRI demonstrate   ulnar lunate abutment with a suspected TFCC tear as well as severe end-stage   thumb CMC joint DJD.  He is being taken to the operating room to undergo right   wrist arthroscopy and debridement of the joint as indicated, possible   debridement and/or repair the TFCC possible ulnar shortening osteotomy and   then #2 is inject right thumb CMC joint with 0.5 mL each of 2% Xylocaine plain   and Celestone.    DESCRIPTION OF PROCEDURE:   The patient was taken to the operating room and placed on the OR table in the   supine position.  He is placed under general anesthetic with an LMA.    Immediately prior to entry into the operating room suite, a supraclavicular   arm block was placed to provide for postoperative pain relief.  The right   upper extremity was prepped and draped in the usual fashion for surgery.    Under sterile prep an injection is placed into the right thumb CMC joint with   0.5 mL each of 2% Xylocaine plain and Celestone. Then the right upper   extremity is placed into the Linvatec wrist traction tower.  A sterile   tourniquet is applied.  Distractive traction is applied 10 pounds and the   radiocarpal joint is injected with 10 mL of 2% Xylocaine with 1:100,000   epinephrine. The anticipated incisional portals were also injected at the 3,   4, 5, and R6 portals with the same fluid. Again this is after the formal prep   and drape. The tourniquet was not inflated.  Two incisions were made in the   wrist.  One is at the 3-4 interval. The other is at the R6 interval. The scope   is placed into the 3-4 interval and sucker shaver is placed into the R6   interval.  There is complete loss of articular cartilage at the proximal pole  of the lunate on its ulnar 1/2 and accompanying that in an opposed position   the entire head of the lunate was also denuded of articular cartilage.  The   TFCC has a tear  that starts at the mid substance of the mid portion of the   sigmoid notch of the distal radius and proceeds ulnad and palmar. The sucker   shaver is used to debride the TFCC and the other nonviable tissues in the   wrist and then instrumentation was removed.  Pictures are taken to demonstrate   the condition of the wrist. The right distal radioulnar joint is stable to   anterior, posterior stress testing.  The wounds were closed with 4-0 nylon   suture using multiple interrupted horizontal mattress suture technique.     Xeroform and sterile gauze dressing is applied followed by application of a   volar plaster wrist splint held in place with an Ace wrap.  The patient is   returned to the recovery area in good condition.  There were no pre-, intra-   or postoperative complications.      ___________________  Jolyn Nap MD  Dictated By:.   [facility]  D:07/04/2013 13:33:27  T: 07/04/2013 14:45:51  [id number]  Authenticated by Sharee Pimple. Richardo Priest, M.D. On 07/07/2013 11:48:56 AM

## 2013-07-18 NOTE — Op Note (Signed)
[facility]  Operation Report  NAME:  [name] [name], [name] [name]  SEX:   M  DATE: 07/18/2013  DOB: 11/22/1945  MR#    [id number]  ROOM:  [id number]  ACCT#  [id number]        PREOPERATIVE DIAGNOSES:  1.  Right ulnar lunate abutment syndrome.  2.  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    POSTOPERATIVE DIAGNOSES:  1.  Right ulnar lunate abutment syndrome.  2.  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    OPERATION PERFORMED:  1.  Right ulnar shortening osteotomy  (5 mm)    2.  Fluoroscopy to assist with #2 above.  3.  Application of bone growth stimulator in the recovery room.    SURGICAL ASSISTANT:  Maryln Gottron, CSA    ANESTHESIA:  General anesthetic with LMA for intraoperative anesthesia, supraclavicular arm   block for postoperative pain relief.    FLUIDS DELIVERED INTRAOPERATIVELY:  1000 mL of Ringer's lactate.    ESTIMATED BLOOD LOSS:  Minimal.    TOTAL TOURNIQUET TIME FOR THE CASE:  16 minutes at 250 mmHg.    COMPLICATIONS:  None.    INDICATIONS:  The patient is a 68 year old right-hand dominant white male who has right   wrist pain.  A previous wrist arthroscopy demonstrated a TFCC tear and severe   ulnar lunate abutment.  He has 4 mm of possible ulnar variance.  He is being   taken to the operating room to undergo ulnar shortening osteotomy and under   fluoroscopic guidance and application of a bone growth stimulator in the   recovery area.    DESCRIPTION OF PROCEDURE:  The patient was taken to the operating room and placed on the OR table in the   supine position.  He is placed under general anesthetic with an LMA.    Immediately prior to entry into the operating room suite, a supraclavicular   arm block was placed to provide for postoperative pain relief.  Once on the OR   table under anesthesia, a tourniquet is applied to the right upper extremity.    The right upper extremity was prepped and draped in the usual fashion for   surgery.  A 3.5 power loupe  magnification is used throughout the case.    Following the prep and drape, the right upper extremity is exsanguinated with   an Esmarch bandage.  The tourniquet is inflated to 250 mmHg.  The total   tourniquet time for the case is 60 minutes.    An incision is made in the ulnar border of the right forearm starting just   proximal to the ulnar head (caput ulna).  This incision is taken proximally a   distance of about 15 to 20 cm.  Soft tissues are then dissected bluntly down   to the overlying periosteum between the flexor and extensor wrist tendons.    The periosteum is incised longitudinally.  A #6400 Beaver blade is employed to   elevate the periosteal tissues to expose the bone.    The TriMed ulnar shortening osteotomy set is employed.  The plate is selected,   placed on the forearm on the palmar side of the ulna and provisionally held   in place with a reduction forceps.  The sliding slot hole is then drilled and   filled with the appropriate length screw and the 3 screws in the opposite side   of the plate are then drilled and filled with the appropriate length screws.  A jig is then selected for 5 mm and was applied to the side of the plate   according to protocol and the osteotomy performed.  This jig was removed and   the B jig is then selected so that a 5 mm wedge of ulnar shaft was removed   using an oscillating saw. The jig is removed.  The wound is thoroughly   irrigated with antibiotic irrigation.  The reduction clamp was then applied   and a K wire is placed into the distal shaft fragment.  The reduction was then   effected after the sliding slot screw is loosened and the sliding slot screw   was then provisionally tightened and the jig applied for the transosteotomy   compression screw was then placed according to protocol.  The proximal shaft   is overdrilled with a 3.2 mm drill bit. The distal hole is then drilled with a   2.3 mm diameter drill bit and the hole is measured.  It measures at 20  mm.    We selected 22 mm partial thread compression screw which is then placed across   the osteotomy site as it was being tightened, we loosen the sliding slot   screw again so good compression was obtained and then retightened the sliding   slot screw.  The remaining 2 screws in the in the distal portion of the plate   (i.e., proximally in relationship to the forearm) are then drilled and filled   with the appropriate length screws.  All K-wires were removed. All jigs were   removed.  The wound is thoroughly irrigated with antibiotic irrigation.  Hard   copy pictures are taken with the C-arm image intensifier in the AP and lateral   planes at the osteotomy site and in the neutral AP plane at the wrist which   demonstrates now the distal ulna is a 1 mm minus ulnar variance as opposed to   the preoperative position of the ulna which was a 4 mm plus variant.  The   wound is thoroughly irrigated with antibiotic irrigation again and the   periosteal tissues are loosely reapproximated with 3-0 Vicryl suture with care   to cover primarily the osteotomy site. Subcutaneous closure is performed with   3-0 Vicryl suture.  Skin is closed with 4-0 nylon suture with multiple   interrupted horizontal mattress suture technique.  Xeroform and sterile gauze   dressing is applied followed by application of volar plaster wrist splint,   held in place with an Ace wrap.  The patient's arm was placed in a sling and   he was awakened out of his general anesthetic and returned to the recovery   area in good condition.  There were no pre-, intra- or postoperative   complications.      ___________________  Jolyn NapGordon J Cyrus Ramsburg MD  Dictated By:.   DO  D:07/18/2013 10:09:49  T: 07/18/2013 13:56:37  [id number]  Authenticated by Sharee PimpleGordon J. Richardo PriestIiams, M.D. On 07/21/2013 08:55:06 AM

## 2013-07-18 NOTE — Op Note (Signed)
[facility]  Operation Report  NAME:  [name], [name]  SEX:   M  DATE: 07/18/2013  DOB: [birth year]  MR#    [id number]  ROOM:  OR05  ACCT#  [id number]        PREOPERATIVE DIAGNOSES:  1.  Right ulnar lunate abutment syndrome.  2.  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    POSTOPERATIVE DIAGNOSES:  1.  Right ulnar lunate abutment syndrome.  2.  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    OPERATION PERFORMED:  1.  Right ulnar shortening osteotomy  (5 mm)    2.  Fluoroscopy to assist with #2 above.  3.  Application of bone growth stimulator in the recovery room.    SURGICAL ASSISTANT:  [name] [name], CSA    ANESTHESIA:  General anesthetic with LMA for intraoperative anesthesia, supraclavicular arm   block for postoperative pain relief.    FLUIDS DELIVERED INTRAOPERATIVELY:  1000 mL of Ringer's lactate.    ESTIMATED BLOOD LOSS:  Minimal.    TOTAL TOURNIQUET TIME FOR THE CASE:  16 minutes at 250 mmHg.    COMPLICATIONS:  None.    INDICATIONS:  The patient is a 68 year old right-hand dominant white male who has right   wrist pain.  A previous wrist arthroscopy demonstrated a TFCC tear and severe   ulnar lunate abutment.  He has 4 mm of possible ulnar variance.  He is being   taken to the operating room to undergo ulnar shortening osteotomy and under   fluoroscopic guidance and application of a bone growth stimulator in the   recovery area.    DESCRIPTION OF PROCEDURE:  The patient was taken to the operating room and placed on the OR table in the   supine position.  He is placed under general anesthetic with an LMA.    Immediately prior to entry into the operating room suite, a supraclavicular   arm block was placed to provide for postoperative pain relief.  Once on the OR   table under anesthesia, a tourniquet is applied to the right upper extremity.    The right upper extremity was prepped and draped in the usual fashion for    surgery.  A 3.5 power loupe magnification is used throughout the case.    Following the prep and drape, the right upper extremity is exsanguinated with   an Esmarch bandage.  The tourniquet is inflated to 250 mmHg.  The total   tourniquet time for the case is 60 minutes.    An incision is made in the ulnar border of the right forearm starting just   proximal to the ulnar head (caput ulna).  This incision is taken proximally a   distance of about 15 to 20 cm.  Soft tissues are then dissected bluntly down   to the overlying periosteum between the flexor and extensor wrist tendons.    The periosteum is incised longitudinally.  A #6400 Beaver blade is employed to   elevate the periosteal tissues to expose the bone.    The TriMed ulnar shortening osteotomy set is employed.  The plate is selected,   placed on the forearm on the palmar side of the ulna and provisionally held   in place with a reduction forceps.  The sliding slot hole is then drilled and   filled with the appropriate length screw and the 3 screws in the opposite side   of the plate are then drilled and filled with the appropriate length screws.  A jig is then selected for 5 mm and was applied to the side of the plate   according to protocol and the osteotomy performed.  This jig was removed and   the B jig is then selected so that a 5 mm wedge of ulnar shaft was removed   using an oscillating saw. The jig is removed.  The wound is thoroughly   irrigated with antibiotic irrigation.  The reduction clamp was then applied   and a K wire is placed into the distal shaft fragment.  The reduction was then   effected after the sliding slot screw is loosened and the sliding slot screw   was then provisionally tightened and the jig applied for the transosteotomy   compression screw was then placed according to protocol.  The proximal shaft   is overdrilled with a 3.2 mm drill bit. The distal hole is then drilled with a    2.3 mm diameter drill bit and the hole is measured.  It measures at 20 mm.    We selected 22 mm partial thread compression screw which is then placed across   the osteotomy site as it was being tightened, we loosen the sliding slot   screw again so good compression was obtained and then retightened the sliding   slot screw.  The remaining 2 screws in the in the distal portion of the plate   (i.e., proximally in relationship to the forearm) are then drilled and filled   with the appropriate length screws.  All K-wires were removed. All jigs were   removed.  The wound is thoroughly irrigated with antibiotic irrigation.  Hard   copy pictures are taken with the C-arm image intensifier in the AP and lateral   planes at the osteotomy site and in the neutral AP plane at the wrist which   demonstrates now the distal ulna is a 1 mm minus ulnar variance as opposed to   the preoperative position of the ulna which was a 4 mm plus variant.  The   wound is thoroughly irrigated with antibiotic irrigation again and the   periosteal tissues are loosely reapproximated with 3-0 Vicryl suture with care   to cover primarily the osteotomy site. Subcutaneous closure is performed with   3-0 Vicryl suture.  Skin is closed with 4-0 nylon suture with multiple   interrupted horizontal mattress suture technique.  Xeroform and sterile gauze   dressing is applied followed by application of volar plaster wrist splint,   held in place with an Ace wrap.  The patient's arm was placed in a sling and   he was awakened out of his general anesthetic and returned to the recovery   area in good condition.  There were no pre-, intra- or postoperative   complications.      ___________________  Jolyn NapGordon J Emalee Knies MD  Dictated By:.   DO  D:07/18/2013 10:09:49  T: 07/18/2013 13:56:37  [id number]  Authenticated by Sharee PimpleGordon J. Richardo PriestIiams, M.D. On 07/21/2013 08:55:06 AM

## 2014-04-10 DIAGNOSIS — N3941 Urge incontinence: Secondary | ICD-10-CM | POA: Insufficient documentation

## 2014-04-10 DIAGNOSIS — N3946 Mixed incontinence: Secondary | ICD-10-CM | POA: Insufficient documentation

## 2015-02-02 ENCOUNTER — Emergency Department
Admission: EM | Admit: 2015-02-02 | Discharge: 2015-02-02 | Disposition: A | Discharge status: Home / Self Care | Payer: Medicare Other | Attending: Emergency Medicine | Admitting: Emergency Medicine

## 2015-02-02 ENCOUNTER — Encounter: Payer: Self-pay | Admitting: Emergency Medicine

## 2015-02-02 DIAGNOSIS — Z880 Allergy status to penicillin: Secondary | ICD-10-CM | POA: Diagnosis not present

## 2015-02-02 DIAGNOSIS — Z7982 Long term (current) use of aspirin: Secondary | ICD-10-CM | POA: Insufficient documentation

## 2015-02-02 DIAGNOSIS — Y998 Other external cause status: Secondary | ICD-10-CM | POA: Diagnosis not present

## 2015-02-02 DIAGNOSIS — W540XXA Bitten by dog, initial encounter: Secondary | ICD-10-CM | POA: Diagnosis not present

## 2015-02-02 DIAGNOSIS — S0121XA Laceration without foreign body of nose, initial encounter: Secondary | ICD-10-CM

## 2015-02-02 DIAGNOSIS — Z23 Encounter for immunization: Secondary | ICD-10-CM | POA: Insufficient documentation

## 2015-02-02 DIAGNOSIS — Y93K1 Activity, walking an animal: Secondary | ICD-10-CM | POA: Diagnosis not present

## 2015-02-02 DIAGNOSIS — I10 Essential (primary) hypertension: Secondary | ICD-10-CM | POA: Diagnosis not present

## 2015-02-02 DIAGNOSIS — Y9289 Other specified places as the place of occurrence of the external cause: Secondary | ICD-10-CM | POA: Diagnosis not present

## 2015-02-02 DIAGNOSIS — Z79899 Other long term (current) drug therapy: Secondary | ICD-10-CM | POA: Diagnosis not present

## 2015-02-02 DIAGNOSIS — Z792 Long term (current) use of antibiotics: Secondary | ICD-10-CM | POA: Diagnosis not present

## 2015-02-02 DIAGNOSIS — S0181XA Laceration without foreign body of other part of head, initial encounter: Secondary | ICD-10-CM

## 2015-02-02 DIAGNOSIS — S0125XA Open bite of nose, initial encounter: Secondary | ICD-10-CM | POA: Diagnosis not present

## 2015-02-02 DIAGNOSIS — S0185XA Open bite of other part of head, initial encounter: Secondary | ICD-10-CM

## 2015-02-02 HISTORY — DX: Unspecified atrial fibrillation: I48.91

## 2015-02-02 HISTORY — DX: Pure hypercholesterolemia, unspecified: E78.00

## 2015-02-02 HISTORY — DX: Hyperlipidemia, unspecified: E78.5

## 2015-02-02 HISTORY — DX: Essential (primary) hypertension: I10

## 2015-02-02 MED ORDER — LIDOCAINE HCL (PF) 1 % IJ SOLN
5.0000 mL | Freq: Once | INTRAMUSCULAR | Status: AC
  Administered 2015-02-02: 5 mL via INTRADERMAL
  Filled 2015-02-02: qty 5

## 2015-02-02 MED ORDER — METRONIDAZOLE 500 MG PO TABS: 500.0000 mg | ORAL_TABLET | Freq: Three times a day (TID) | ORAL | Status: AC

## 2015-02-02 MED ORDER — TETANUS-DIPHTH-ACELL PERTUSSIS 5-2.5-18.5 LF-MCG/0.5 IM SUSP
0.5000 mL | Freq: Once | INTRAMUSCULAR | Status: AC
  Administered 2015-02-02: 0.5 mL via INTRAMUSCULAR
  Filled 2015-02-02: qty 0.5

## 2015-02-02 MED ORDER — DOXYCYCLINE HYCLATE 100 MG PO CAPS: 100.0000 mg | ORAL_CAPSULE | Freq: Two times a day (BID) | ORAL | Status: DC

## 2015-02-02 NOTE — ED Provider Notes (Signed)
CSN: [id number]     Arrival date & time 02/02/15  1019 History   First MD Initiated Contact with Patient 02/02/15 1024     Chief Complaint  Patient presents with  . Animal Bite    HPI Comments: 69 year old male presents today complaining of dog bite to face. Reports he was talking to one of his neighbors and petting their dog. The dog was acting friendly, and then jumped on his shoulders. It bit his nose very quickly. They were able to control the bleeding with direct pressure. Neighbor reports rabies status is up to date. Pt has no pain, tetanus is out of date per him.   Patient is a 69 y.o. male presenting with animal bite. The history is provided by the patient, a parent and the spouse.  Animal Bite Contact animal:  Dog Location:  Face Facial injury location:  Nose, face and R cheek Time since incident:  30 minutes Pain details:    Severity:  No pain Incident location:  Home Provoked: unprovoked   Notifications:  None Animal's rabies vaccination status:  Up to date Tetanus status:  Out of date Relieved by:  None tried Worsened by:  Nothing tried Ineffective treatments:  None tried Associated symptoms: no fever     Past Medical History  Diagnosis Date  . Hypertension   . A-fib   . Hyperlipidemia   . Hypercholesteremia    History reviewed. No pertinent past surgical history. No family history on file. Social History  Substance Use Topics  . Smoking status: Never Smoker   . Smokeless tobacco: None  . Alcohol Use: No    Review of Systems  Constitutional: Negative for fever and chills.  HENT: Positive for nosebleeds.   Eyes: Negative for pain, redness and visual disturbance.  Skin: Positive for wound.  All other systems reviewed and are negative.     Allergies  Penicillins  Home Medications   Prior to Admission medications   Medication Sig Start Date End Date Taking? Authorizing Provider  aspirin 325 MG tablet Take 325 mg by mouth daily.   Yes Historical  Provider, MD  candesartan (ATACAND) 8 MG tablet Take 16 mg by mouth daily.   Yes Historical Provider, MD  celecoxib (CELEBREX) 200 MG capsule Take 200 mg by mouth 2 (two) times daily.   Yes Historical Provider, MD  esomeprazole (NEXIUM) 40 MG capsule Take 40 mg by mouth daily at 12 noon.   Yes Historical Provider, MD  levocetirizine (XYZAL) 5 MG tablet Take 5 mg by mouth every evening.   Yes Historical Provider, MD  doxycycline (VIBRAMYCIN) 100 MG capsule Take 1 capsule (100 mg total) by mouth 2 (two) times daily. 02/02/15   Corliss Parish, PA-C  metroNIDAZOLE (FLAGYL) 500 MG tablet Take 1 tablet (500 mg total) by mouth 3 (three) times daily. 02/02/15 02/12/15  Shayne Alken V, PA-C   BP 153/75 mmHg  Pulse 68  Temp(Src) 98.2 F (36.8 C) (Oral)  Resp 20  Ht 5\' 10"  (1.778 m)  Wt 202 lb (91.627 kg)  BMI 28.98 kg/m2  SpO2 99% Physical Exam  Constitutional: He is oriented to person, place, and time. Vital signs are normal. He appears well-developed and well-nourished.  Non-toxic appearance. He does not have a sickly appearance. He does not appear ill.  HENT:  Head: Normocephalic and atraumatic.  Nose: Nose lacerations present. No rhinorrhea, septal deviation or nasal septal hematoma. No epistaxis.    Eyes: Pupils are equal, round, and reactive to  light.  Neurological: He is alert and oriented to person, place, and time.  Skin: Skin is dry. Abrasion and laceration noted.     Psychiatric: He has a normal mood and affect. His behavior is normal. Judgment and thought content normal.  Nursing note and vitals reviewed.   ED Course  LACERATION REPAIR Date/Time: 02/02/2015 11:03 AM Performed by: Corliss Parish Authorized by: Corliss Parish Consent: Verbal consent obtained. Written consent obtained. Risks and benefits: risks, benefits and alternatives were discussed Consent given by: patient Patient understanding: patient states understanding of the procedure being performed Patient consent: the  patient's understanding of the procedure matches consent given Procedure consent: procedure consent matches procedure scheduled Relevant documents: relevant documents present and verified Required items: required blood products, implants, devices, and special equipment available Patient identity confirmed: verbally with patient Time out: Immediately prior to procedure a "time out" was called to verify the correct patient, procedure, equipment, support staff and site/side marked as required. Body area: head/neck Location details: nose Laceration length: 1 cm Foreign bodies: no foreign bodies Tendon involvement: none Nerve involvement: none Vascular damage: no Anesthesia method: none. Patient sedated: no Preparation: Patient was prepped and draped in the usual sterile fashion. Irrigation solution: saline Irrigation method: jet lavage Amount of cleaning: extensive Debridement: none Degree of undermining: none Fascia closure: 5-0 Vicryl Number of sutures: 1 Technique: simple Approximation: loose Approximation difficulty: simple Patient tolerance: Patient tolerated the procedure well with no immediate complications Comments: No anesthesia was used since there was only one suture to be placed   LACERATION REPAIR Date/Time: 02/02/2015 11:05 AM Performed by: Corliss Parish Authorized by: Corliss Parish Consent: Verbal consent obtained. Risks and benefits: risks, benefits and alternatives were discussed Consent given by: patient Patient understanding: patient states understanding of the procedure being performed Patient consent: the patient's understanding of the procedure matches consent given Procedure consent: procedure consent matches procedure scheduled Relevant documents: relevant documents present and verified Patient identity confirmed: verbally with patient Time out: Immediately prior to procedure a "time out" was called to verify the correct patient, procedure, equipment,  support staff and site/side marked as required. Body area: head/neck Location details: right cheek Laceration length: 1 cm Foreign bodies: no foreign bodies Tendon involvement: none Nerve involvement: none Vascular damage: no Patient sedated: no Preparation: Patient was prepped and draped in the usual sterile fashion. Irrigation solution: saline Irrigation method: jet lavage Amount of cleaning: extensive Debridement: none Degree of undermining: none Skin closure: Steri-Strips Number of sutures: 1 Technique: simple Approximation: loose Approximation difficulty: simple Patient tolerance: Patient tolerated the procedure well with no immediate complications   (including critical care time) Labs Review Labs Reviewed - No data to display  Imaging Review No results found. I have personally reviewed and evaluated these images and lab results as part of my medical decision-making.   EKG Interpretation None      MDM  Extensive cleaning with saline performed on face. Steri strip to wound on right cheek, one absorbable suture to right nare. Due to Amoxicillin allergy treat with Doxycycline and Flagyl for 10 days. Educated on signs to look for developing infection. Tdap was updated. Known dog, UTD on shots - not reported.  Final diagnoses:  Laceration of nose, initial encounter  Laceration of face, initial encounter  Dog bite of face, initial encounter  Need for Tdap vaccination        Corliss Parish, PA-C 02/02/15 1107  Earleen Newport, MD 02/02/15 1438

## 2015-02-02 NOTE — Discharge Instructions (Signed)

## 2015-02-02 NOTE — ED Notes (Signed)
Dog bite to face   Small lacerations noted to nose and between eyes   Bleeding controlled

## 2015-04-10 DIAGNOSIS — Z79899 Other long term (current) drug therapy: Secondary | ICD-10-CM | POA: Insufficient documentation

## 2015-04-10 DIAGNOSIS — E559 Vitamin D deficiency, unspecified: Secondary | ICD-10-CM | POA: Insufficient documentation

## 2015-04-25 ENCOUNTER — Inpatient Hospital Stay: Admit: 2015-04-25 | Payer: MEDICARE | Primary: Emergency Medicine

## 2015-04-25 DIAGNOSIS — M7031 Other bursitis of elbow, right elbow: Secondary | ICD-10-CM

## 2015-04-25 LAB — CBC W/O DIFF
HCT: 38.9 % (ref 37.0–50.0)
HGB: 13.1 gm/dl (ref 12.4–17.2)
MCH: 30.7 pg (ref 23.0–34.6)
MCHC: 33.7 gm/dl (ref 30.0–36.0)
MCV: 91.1 fL (ref 80.0–98.0)
MPV: 9.4 fL (ref 6.0–10.0)
PLATELET: 286 10*3/uL (ref 140–450)
RBC: 4.27 M/uL (ref 3.80–5.70)
RDW-SD: 42.5 (ref 35.1–43.9)
WBC: 5 10*3/uL (ref 4.0–11.0)

## 2015-04-25 LAB — C REACTIVE PROTEIN, QT: C-Reactive protein: 5.7 mg/L — ABNORMAL HIGH (ref 0.0–2.9)

## 2015-04-25 LAB — SED RATE (ESR): Sed rate (ESR): 8 mm/Hr (ref 0–20)

## 2015-04-25 LAB — RHEUMATOID FACTOR, QL: Rheumatoid factor: <10 IU/ml (ref 0–14)

## 2015-04-25 LAB — URIC ACID: Uric acid: 6.2 mg/dl (ref 3.5–7.2)

## 2015-04-27 LAB — CYCLIC CITRUL PEPTIDE AB, IGG: CCP Ab, IgG: <16 Units (ref <20)

## 2015-04-29 LAB — HLA-B27
HLA-B27 ANTIGEN: NEGATIVE
HLA-B27 Ag: NEGATIVE

## 2015-04-29 LAB — ANTINUCLEAR ANTIBODIES, IFA: Antinuclear Abs, IFA: POSITIVE — AB

## 2016-03-17 DIAGNOSIS — H57819 Brow ptosis, unspecified: Secondary | ICD-10-CM | POA: Insufficient documentation

## 2016-03-17 DIAGNOSIS — H02413 Mechanical ptosis of bilateral eyelids: Secondary | ICD-10-CM | POA: Insufficient documentation

## 2016-03-17 DIAGNOSIS — H02831 Dermatochalasis of right upper eyelid: Secondary | ICD-10-CM | POA: Insufficient documentation

## 2016-05-26 HISTORY — PX: COSMETIC SURGERY: SHX468

## 2016-07-12 ENCOUNTER — Emergency Department
Admission: EM | Admit: 2016-07-12 | Discharge: 2016-07-12 | Disposition: A | Discharge status: Home / Self Care | Payer: Medicare Other | Attending: Emergency Medicine | Admitting: Emergency Medicine

## 2016-07-12 ENCOUNTER — Emergency Department: Payer: Medicare Other

## 2016-07-12 ENCOUNTER — Encounter: Payer: Self-pay | Admitting: Emergency Medicine

## 2016-07-12 DIAGNOSIS — Y999 Unspecified external cause status: Secondary | ICD-10-CM | POA: Insufficient documentation

## 2016-07-12 DIAGNOSIS — I10 Essential (primary) hypertension: Secondary | ICD-10-CM | POA: Diagnosis not present

## 2016-07-12 DIAGNOSIS — M545 Low back pain, unspecified: Secondary | ICD-10-CM

## 2016-07-12 DIAGNOSIS — S3992XA Unspecified injury of lower back, initial encounter: Secondary | ICD-10-CM | POA: Diagnosis present

## 2016-07-12 DIAGNOSIS — X501XXA Overexertion from prolonged static or awkward postures, initial encounter: Secondary | ICD-10-CM | POA: Diagnosis not present

## 2016-07-12 DIAGNOSIS — Y92009 Unspecified place in unspecified non-institutional (private) residence as the place of occurrence of the external cause: Secondary | ICD-10-CM | POA: Insufficient documentation

## 2016-07-12 DIAGNOSIS — Y939 Activity, unspecified: Secondary | ICD-10-CM | POA: Diagnosis not present

## 2016-07-12 DIAGNOSIS — Z7982 Long term (current) use of aspirin: Secondary | ICD-10-CM | POA: Insufficient documentation

## 2016-07-12 DIAGNOSIS — Z79899 Other long term (current) drug therapy: Secondary | ICD-10-CM | POA: Diagnosis not present

## 2016-07-12 DIAGNOSIS — S39012A Strain of muscle, fascia and tendon of lower back, initial encounter: Secondary | ICD-10-CM | POA: Insufficient documentation

## 2016-07-12 LAB — URINALYSIS, ROUTINE W REFLEX MICROSCOPIC
Bilirubin Urine: NEGATIVE
GLUCOSE, UA: NEGATIVE mg/dL
Hgb urine dipstick: NEGATIVE
Ketones, ur: NEGATIVE mg/dL
Leukocytes, UA: NEGATIVE
Nitrite: NEGATIVE
PH: 5 (ref 5.0–8.0)
Protein, ur: NEGATIVE mg/dL
SPECIFIC GRAVITY, URINE: 1.008 (ref 1.005–1.030)

## 2016-07-12 MED ORDER — CYCLOBENZAPRINE HCL 5 MG PO TABS: 5.0000 mg | ORAL_TABLET | Freq: Three times a day (TID) | ORAL | 0 refills | Status: DC | PRN

## 2016-07-12 MED ORDER — CYCLOBENZAPRINE HCL 10 MG PO TABS
10.0000 mg | ORAL_TABLET | Freq: Once | ORAL | Status: AC
  Administered 2016-07-12: 10 mg via ORAL
  Filled 2016-07-12: qty 1

## 2016-07-12 MED ORDER — DICLOFENAC SODIUM 75 MG PO TBEC
75.0000 mg | DELAYED_RELEASE_TABLET | Freq: Once | ORAL | Status: AC
  Administered 2016-07-12: 75 mg via ORAL
  Filled 2016-07-12: qty 1

## 2016-07-12 MED ORDER — DICLOFENAC SODIUM 75 MG PO TBEC: 75.0000 mg | DELAYED_RELEASE_TABLET | Freq: Two times a day (BID) | ORAL | 1 refills | Status: DC

## 2016-07-12 NOTE — ED Provider Notes (Signed)
[facility] Emergency Department Provider Note ____________________________________________  Time seen: 1355  I have reviewed the triage vital signs and the nursing notes.  HISTORY  Chief Complaint  Back Pain  HPI Ruben Willis is a 71 y.o. male presents to the ED for evaluation of 2 weeks of left-sided back/flank pain. He denies recent injury, but has been working on remodeling a home at the [employer]. He describes intermittent, sharp pains to the left side of his lower back. The pains are aggravated by deep breaths and movement. He also notes constant, "irritating," dull, aching pain. He has dose Tylenol as needed for pain. He denies any history of ongoing or chronic back pain. He gives a remote history a fall onto is back, in December. He fell from a height of about 3 feet, but denies any injury or sequela; he never sought care since he was uninjured.    Past Medical History:  Diagnosis Date  . A-fib ([place])   . Hypercholesteremia   . Hyperlipidemia   . Hypertension    There are no active problems to display for this patient.  History reviewed. No pertinent surgical history.  Prior to Admission medications   Medication Sig Start Date End Date Taking? Authorizing Provider  aspirin 325 MG tablet Take 325 mg by mouth daily.    Historical Provider, MD  candesartan (ATACAND) 8 MG tablet Take 16 mg by mouth daily.    Historical Provider, MD  celecoxib (CELEBREX) 200 MG capsule Take 200 mg by mouth 2 (two) times daily.    Historical Provider, MD  cyclobenzaprine (FLEXERIL) 5 MG tablet Take 1 tablet (5 mg total) by mouth 3 (three) times daily as needed for muscle spasms. 07/12/16   Madisen Ludvigsen V Bacon Margia Wiesen, PA-C  diclofenac (VOLTAREN) 75 MG EC tablet Take 1 tablet (75 mg total) by mouth 2 (two) times daily. 07/12/16   Makaveli Hoard V Bacon Dontarius Sheley, PA-C  doxycycline (VIBRAMYCIN) 100 MG capsule Take 1 capsule (100 mg total) by mouth 2 (two) times daily. 02/02/15   Harvest Dark, PA-C  esomeprazole (NEXIUM) 40 MG capsule Take 40 mg by mouth daily at 12 noon.    Historical Provider, MD  ezetimibe-simvastatin (VYTORIN) 10-20 MG per tablet Take 1 tablet by mouth daily.    Historical Provider, MD  levocetirizine (XYZAL) 5 MG tablet Take 5 mg by mouth every evening.    Historical Provider, MD  propafenone (RYTHMOL SR) 325 MG 12 hr capsule Take 325 mg by mouth 2 (two) times daily.    Historical Provider, MD   Allergies Penicillins  History reviewed. No pertinent family history.  Social History Social History  Substance Use Topics  . Smoking status: Never Smoker  . Smokeless tobacco: Never Used  . Alcohol use No    Review of Systems  Constitutional: Negative for fever. Cardiovascular: Negative for chest pain. Respiratory: Negative for shortness of breath. Gastrointestinal: Negative for abdominal pain, vomiting and diarrhea. Genitourinary: Negative for dysuria, hematuria, retention, or frequency. Musculoskeletal: Positive for back pain. Skin: Negative for rash. Neurological: Negative for headaches, focal weakness or numbness. ____________________________________________  PHYSICAL EXAM:  VITAL SIGNS: ED Triage Vitals  Enc Vitals Group     BP 07/12/16 1305 (!) 147/81     Pulse Rate 07/12/16 1305 61     Resp 07/12/16 1305 18     Temp 07/12/16 1305 97.9 F (36.6 C)     Temp Source 07/12/16 1305 Oral     SpO2 07/12/16 1305 95 %  Weight 07/12/16 1303 206 lb (93.4 kg)     Height 07/12/16 1303 5\' 9"  (1.753 m)     Head Circumference --      Peak Flow --      Pain Score 07/12/16 1303 6     Pain Loc --      Pain Edu? --      Excl. in [place]? --     Constitutional: Alert and oriented. Well appearing and in no distress. Head: Normocephalic and atraumatic. Cardiovascular: Normal rate, regular rhythm. Normal distal pulses. Respiratory: Normal respiratory effort. No wheezes/rales/rhonchi. Gastrointestinal: Soft and nontender. No  distention. Musculoskeletal: No spinal alignment without midline tenderness, spasm, deformity, step-off. Patient with mild tenderness to the touch over the left flank and lumbar sacral junction. No palpable deformity, rigidity, or muscle defect is noted. Normal transition from sit to stand. Normal toe and heel raise on exam. Normal full lumbar flexion and extension range. Negative Trendelenburg. Nontender with normal range of motion in all extremities.  Neurologic: Cranial nerves II through XII grossly intact. Normal LE DTRs bilaterally. Normal toe dorsiflexion and foot eversion on exam. Normal gait without ataxia. Normal speech and language. No gross focal neurologic deficits are appreciated. Skin:  Skin is warm, dry and intact. No rash noted. ____________________________________________   RADIOLOGY  LS Spine  IMPRESSION: No acute abnormality.  Multilevel degenerative disease. ____________________________________________  LABORATORY  Labs Reviewed  URINALYSIS, ROUTINE W REFLEX MICROSCOPIC - Abnormal; Notable for the following:       Result Value   Color, Urine YELLOW (*)    APPearance CLEAR (*)    All other components within normal limits  ____________________________________________  PROCEDURES  Flexeril 10 mg PO Volteren 75 mg PO ________________________________________________________ INITIAL IMPRESSION / ASSESSMENT AND PLAN / ED COURSE  Patient with acute left-sided lumbar sacral strain without sciatica. He is reassured by his negative urinalysis and x-ray showing only chronic degenerative changes. His exam was without any signs of any acute neuromuscular deficit. He has very reproducible, palpable pain to the left flank at the lumbar sacral junction. He is discharged at this time with a prescription for Voltaren and Flexeril doses directed. He should notice daily Celebrex while taking the Voltaren. He is advised to follow-up with his primary care provider if symptoms persist  beyond a timeframe that is expected. ____________________________________________  FINAL CLINICAL IMPRESSION(S) / ED DIAGNOSES  Final diagnoses:  Acute left-sided low back pain without sciatica  Strain of lumbar region, initial encounter      Melvenia Needles, PA-C 07/12/16 Jaconita, MD 07/17/16 [phone]

## 2016-07-12 NOTE — Discharge Instructions (Signed)
Your exam and x-ray are consistent with a muscle strain with underlying arthritis and degenerative disc disease (DDD). You should take the prescription meds as directed. Stop the Celebrex while taking the Voltaren. Follow-up with your provider if symptoms don't improve as expected.

## 2016-07-12 NOTE — ED Notes (Signed)
Pt c/o L sided mid back pain. 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 Pt states pain is worse with movement at this time. No obvious deformity or injury.

## 2016-07-12 NOTE — ED Triage Notes (Addendum)
Pt c/o left mid/lower back pain.  Has been remodeling house at [place].  Pain is worse with movement and when standing a long time.  NAD. Ambulatory to triage. Pain worse in certain positions.

## 2016-08-18 ENCOUNTER — Encounter: Payer: Self-pay | Admitting: Emergency Medicine

## 2016-08-18 ENCOUNTER — Emergency Department
Admission: EM | Admit: 2016-08-18 | Discharge: 2016-08-18 | Disposition: A | Discharge status: Home / Self Care | Payer: Medicare Other | Attending: Emergency Medicine | Admitting: Emergency Medicine

## 2016-08-18 ENCOUNTER — Emergency Department: Payer: Medicare Other

## 2016-08-18 DIAGNOSIS — I10 Essential (primary) hypertension: Secondary | ICD-10-CM | POA: Diagnosis not present

## 2016-08-18 DIAGNOSIS — Y929 Unspecified place or not applicable: Secondary | ICD-10-CM | POA: Diagnosis not present

## 2016-08-18 DIAGNOSIS — Y939 Activity, unspecified: Secondary | ICD-10-CM | POA: Insufficient documentation

## 2016-08-18 DIAGNOSIS — S299XXA Unspecified injury of thorax, initial encounter: Secondary | ICD-10-CM | POA: Diagnosis present

## 2016-08-18 DIAGNOSIS — Z7982 Long term (current) use of aspirin: Secondary | ICD-10-CM | POA: Diagnosis not present

## 2016-08-18 DIAGNOSIS — W01198A Fall on same level from slipping, tripping and stumbling with subsequent striking against other object, initial encounter: Secondary | ICD-10-CM | POA: Diagnosis not present

## 2016-08-18 DIAGNOSIS — S2242XA Multiple fractures of ribs, left side, initial encounter for closed fracture: Secondary | ICD-10-CM | POA: Insufficient documentation

## 2016-08-18 DIAGNOSIS — Y999 Unspecified external cause status: Secondary | ICD-10-CM | POA: Diagnosis not present

## 2016-08-18 HISTORY — DX: Other pulmonary embolism without acute cor pulmonale: I26.99

## 2016-08-18 MED ORDER — TRAMADOL HCL 50 MG PO TABS: 50.0000 mg | ORAL_TABLET | Freq: Two times a day (BID) | ORAL | 0 refills | Status: DC | PRN

## 2016-08-18 NOTE — ED Notes (Signed)
Pt presents after falling when he was repairing his deck. Pt states that he tripped, fell, and hit his chest on the corner of a board. Pt reports little pain when he is sitting still but that some movements cause increased pain. NAD noted.

## 2016-08-18 NOTE — ED Notes (Signed)
Pt to xray

## 2016-08-18 NOTE — Discharge Instructions (Signed)
May take extra strength Tylenol for pain. Take tramadol as needed for acute pain.

## 2016-08-18 NOTE — ED Triage Notes (Signed)
Patient presents to the ED with left chest tenderness and swelling after patient fell this morning after tripping on his porch.  Patient's chest hit a wooden board.  Patient denies taking blood thinners other than a baby aspirin.  Patient is in no obvious distress at this time.  Sitting comfortably in triage.

## 2016-08-18 NOTE — ED Provider Notes (Signed)
[facility] Emergency Department Provider Note   ____________________________________________   First MD Initiated Contact with Patient 08/18/16 1247     (approximate)  I have reviewed the triage vital signs and the nursing notes.   HISTORY  Chief Complaint Fall    HPI Ruben Willis is a 71 y.o. male patient complaining of left chest wall tenderness. Secondary to a contusion. Patient states tripped and fell and hit his chest on a wooden board. Patient stated pain increases with deep inspiration and movement of the left arm. Patient say lungs acute still his discomfort is minor.Instead occurred approximately 2 hours ago. Patient rates his pain as a constant 2/10. Patient stated pain increases to a 6/10 with movement and deep respirations.   Past Medical History:  Diagnosis Date  . A-fib ([place])   . Hypercholesteremia   . Hyperlipidemia   . Hypertension   . Pulmonary embolism (HCC)     There are no active problems to display for this patient.   Past Surgical History:  Procedure Laterality Date  . APPENDECTOMY    . COSMETIC SURGERY     eye lids  . NASAL SEPTUM SURGERY      Prior to Admission medications   Medication Sig Start Date End Date Taking? Authorizing Provider  aspirin 325 MG tablet Take 325 mg by mouth daily.    Historical Provider, MD  candesartan (ATACAND) 8 MG tablet Take 16 mg by mouth daily.    Historical Provider, MD  celecoxib (CELEBREX) 200 MG capsule Take 200 mg by mouth 2 (two) times daily.    Historical Provider, MD  cyclobenzaprine (FLEXERIL) 5 MG tablet Take 1 tablet (5 mg total) by mouth 3 (three) times daily as needed for muscle spasms. 07/12/16   Jenise V Bacon Menshew, PA-C  diclofenac (VOLTAREN) 75 MG EC tablet Take 1 tablet (75 mg total) by mouth 2 (two) times daily. 07/12/16   Jenise V Bacon Menshew, PA-C  doxycycline (VIBRAMYCIN) 100 MG capsule Take 1 capsule (100 mg total) by mouth 2 (two) times daily. 02/02/15    Harvest Dark, PA-C  esomeprazole (NEXIUM) 40 MG capsule Take 40 mg by mouth daily at 12 noon.    Historical Provider, MD  ezetimibe-simvastatin (VYTORIN) 10-20 MG per tablet Take 1 tablet by mouth daily.    Historical Provider, MD  levocetirizine (XYZAL) 5 MG tablet Take 5 mg by mouth every evening.    Historical Provider, MD  propafenone (RYTHMOL SR) 325 MG 12 hr capsule Take 325 mg by mouth 2 (two) times daily.    Historical Provider, MD  traMADol (ULTRAM) 50 MG tablet Take 1 tablet (50 mg total) by mouth every 12 (twelve) hours as needed. 08/18/16   Sable Feil, PA-C    Allergies Penicillins  No family history on file.  Social History Social History  Substance Use Topics  . Smoking status: Never Smoker  . Smokeless tobacco: Never Used  . Alcohol use No    Review of Systems Constitutional: No fever/chills Eyes: No visual changes. ENT: No sore throat. Cardiovascular: Denies chest pain. Respiratory: Denies shortness of breath. Gastrointestinal: No abdominal pain.  No nausea, no vomiting.  No diarrhea.  No constipation. Genitourinary: Negative for dysuria. Musculoskeletal: Negative for back pain. Skin: Negative for rash. Neurological: Negative for headaches, focal weakness or numbness. Endocrine:Hypertension and hyperlipidemia. Hematological/Lymphatic: Allergic/Immunilogical: Penicillin ____________________________________________   PHYSICAL EXAM:  VITAL SIGNS: ED Triage Vitals [08/18/16 1233]  Enc Vitals Group     BP (!) 146/85  Pulse Rate 70     Resp 18     Temp 97.5 F (36.4 C)     Temp Source Oral     SpO2 97 %     Weight 202 lb (91.6 kg)     Height 5\' 9"  (1.753 m)     Head Circumference      Peak Flow      Pain Score 2     Pain Loc      Pain Edu?      Excl. in [place]?     Constitutional: Alert and oriented. Well appearing and in no acute distress. Eyes: Conjunctivae are normal. PERRL. EOMI. Head: Atraumatic. Nose: No  congestion/rhinnorhea. Mouth/Throat: Mucous membranes are moist.  Oropharynx non-erythematous. Neck: No stridor.  No cervical spine tenderness to palpation.**} Hematological/Lymphatic/Immunilogical: No cervical lymphadenopathy. Cardiovascular: Normal rate, regular rhythm. Grossly normal heart sounds.  Good peripheral circulation. Respiratory: Normal respiratory effort.  No retractions. Lungs CTAB. Gastrointestinal: Soft and nontender. No distention. No abdominal bruits. No CVA tenderness. Musculoskeletal: No lower extremity tenderness nor edema.  No joint effusions. Neurologic:  Normal speech and language. No gross focal neurologic deficits are appreciated. No gait instability. Skin:  Skin is warm, dry and intact. No rash noted. Psychiatric: Mood and affect are normal. Speech and behavior are normal.  ____________________________________________   LABS (all labs ordered are listed, but only abnormal results are displayed)  Labs Reviewed - No data to display ____________________________________________  EKG   ____________________________________________  RADIOLOGY  X-rays reveal nondisplaced fractures of the eighth through the ninth left lateral ribs. ____________________________________________   PROCEDURES  Procedure(s) performed: None  Procedures  Critical Care performed: No  ____________________________________________   INITIAL IMPRESSION / ASSESSMENT AND PLAN / ED COURSE  Pertinent labs & imaging results that were available during my care of the patient were reviewed by me and considered in my medical decision making (see chart for details).  Pain secondary chest wall contusion. X-rays to evaluate left lateral rib.      ____________________________________________   FINAL CLINICAL IMPRESSION(S) / ED DIAGNOSES  Final diagnoses:  Multiple fractures of ribs, left side, initial encounter for closed fracture      NEW MEDICATIONS STARTED DURING THIS  VISIT:  Discharge Medication List as of 08/18/2016  1:20 PM    START taking these medications   Details  traMADol (ULTRAM) 50 MG tablet Take 1 tablet (50 mg total) by mouth every 12 (twelve) hours as needed., Starting Tue 08/18/2016, Print         Note:  This document was prepared using Dragon voice recognition software and may include unintentional dictation errors.    Sable Feil, PA-C 08/18/16 [place], MD 08/19/16 [phone]

## 2016-09-11 ENCOUNTER — Emergency Department: Payer: Medicare Other

## 2016-09-11 ENCOUNTER — Emergency Department
Admission: EM | Admit: 2016-09-11 | Discharge: 2016-09-11 | Disposition: A | Discharge status: Home / Self Care | Payer: Medicare Other | Attending: Emergency Medicine | Admitting: Emergency Medicine

## 2016-09-11 ENCOUNTER — Encounter: Payer: Self-pay | Admitting: Emergency Medicine

## 2016-09-11 DIAGNOSIS — Z7982 Long term (current) use of aspirin: Secondary | ICD-10-CM | POA: Insufficient documentation

## 2016-09-11 DIAGNOSIS — I10 Essential (primary) hypertension: Secondary | ICD-10-CM | POA: Diagnosis not present

## 2016-09-11 DIAGNOSIS — S90122A Contusion of left lesser toe(s) without damage to nail, initial encounter: Secondary | ICD-10-CM | POA: Insufficient documentation

## 2016-09-11 DIAGNOSIS — S90212A Contusion of left great toe with damage to nail, initial encounter: Secondary | ICD-10-CM | POA: Diagnosis not present

## 2016-09-11 DIAGNOSIS — S9032XA Contusion of left foot, initial encounter: Secondary | ICD-10-CM

## 2016-09-11 DIAGNOSIS — Y999 Unspecified external cause status: Secondary | ICD-10-CM | POA: Insufficient documentation

## 2016-09-11 DIAGNOSIS — Z79899 Other long term (current) drug therapy: Secondary | ICD-10-CM | POA: Insufficient documentation

## 2016-09-11 DIAGNOSIS — S99922A Unspecified injury of left foot, initial encounter: Secondary | ICD-10-CM | POA: Diagnosis present

## 2016-09-11 DIAGNOSIS — W208XXA Other cause of strike by thrown, projected or falling object, initial encounter: Secondary | ICD-10-CM | POA: Insufficient documentation

## 2016-09-11 DIAGNOSIS — Y9389 Activity, other specified: Secondary | ICD-10-CM | POA: Insufficient documentation

## 2016-09-11 DIAGNOSIS — Y929 Unspecified place or not applicable: Secondary | ICD-10-CM | POA: Diagnosis not present

## 2016-09-11 MED ORDER — HYDROCODONE-ACETAMINOPHEN 5-325 MG PO TABS: 1.0000 | ORAL_TABLET | ORAL | 0 refills | Status: DC | PRN

## 2016-09-11 NOTE — ED Notes (Signed)
Pt states he dropped a 2x8 on his left big toe last night, presents with dark toenail and redness on big toe, ambulatory

## 2016-09-11 NOTE — ED Triage Notes (Signed)
Pt in via POV, ambulatory to triage room with out difficulty.  Pt reports dropping a 2x8x16 wood board on his left foot last night.  Pt with complaints of pain to left great toe.

## 2016-09-11 NOTE — Discharge Instructions (Signed)
Follow up with your regular doctor if any continued problems.  Ice and elevate as needed for swelling.  Wear wooden shoe for protection and support.  You may take tylenol or ibuprofen for pain.  Norco is for severe to moderate pain and can be taken.  Do not drive or operate machines if you take this medication as it is a narcotic

## 2016-09-11 NOTE — ED Provider Notes (Signed)
[facility] Emergency Department Provider Note   ____________________________________________   First MD Initiated Contact with Patient 09/11/16 1125     (approximate)  I have reviewed the triage vital signs and the nursing notes.   HISTORY  Chief Complaint Foot Pain    HPI Ruben Willis is a 71 y.o. male . Patient states he dropped a 2 x 8 x 16 board on his foot while building a deck last night.  This morning he woke with increased swelling to his left great toe and 2nd toe.  Increased pain with walking.  He took tylenol with some relief.  His great toenail has also turned dark. He rates his pain as a 7/10 now.    Past Medical History:  Diagnosis Date  . A-fib ([place])   . Hypercholesteremia   . Hyperlipidemia   . Hypertension   . Pulmonary embolism (HCC)     There are no active problems to display for this patient.   Past Surgical History:  Procedure Laterality Date  . APPENDECTOMY    . COSMETIC SURGERY     eye lids  . NASAL SEPTUM SURGERY      Prior to Admission medications   Medication Sig Start Date End Date Taking? Authorizing Provider  aspirin 325 MG tablet Take 325 mg by mouth daily.    Historical Provider, MD  candesartan (ATACAND) 8 MG tablet Take 16 mg by mouth daily.    Historical Provider, MD  celecoxib (CELEBREX) 200 MG capsule Take 200 mg by mouth 2 (two) times daily.    Historical Provider, MD  esomeprazole (NEXIUM) 40 MG capsule Take 40 mg by mouth daily at 12 noon.    Historical Provider, MD  ezetimibe-simvastatin (VYTORIN) 10-20 MG per tablet Take 1 tablet by mouth daily.    Historical Provider, MD  HYDROcodone-acetaminophen (NORCO/VICODIN) 5-325 MG tablet Take 1 tablet by mouth every 4 (four) hours as needed for moderate pain. 09/11/16   Johnn Hai, PA-C  levocetirizine (XYZAL) 5 MG tablet Take 5 mg by mouth every evening.    Historical Provider, MD  propafenone (RYTHMOL SR) 325 MG 12 hr capsule Take 325 mg by  mouth 2 (two) times daily.    Historical Provider, MD    Allergies Penicillins  No family history on file.  Social History Social History  Substance Use Topics  . Smoking status: Never Smoker  . Smokeless tobacco: Never Used  . Alcohol use No    Review of Systems Constitutional: No fever/chills Cardiovascular: Denies chest pain. Respiratory: Denies shortness of breath. Gastrointestinal:   No nausea, no vomiting. Musculoskeletal: positive for left foot pain Skin: intact Neurological: Negative for  focal weakness or numbness.   ____________________________________________   PHYSICAL EXAM:  VITAL SIGNS: ED Triage Vitals  Enc Vitals Group     BP 09/11/16 1107 136/83     Pulse Rate 09/11/16 1107 65     Resp 09/11/16 1107 18     Temp 09/11/16 1107 97.7 F (36.5 C)     Temp Source 09/11/16 1107 Oral     SpO2 09/11/16 1107 98 %     Weight 09/11/16 1107 199 lb (90.3 kg)     Height 09/11/16 1107 5\' 9"  (1.753 m)     Head Circumference --      Peak Flow --      Pain Score 09/11/16 1106 7     Pain Loc --      Pain Edu? --  Excl. in [place]? --     Constitutional: Alert and oriented. Well appearing and in no acute distress. Eyes: Conjunctivae are normal. PERRL. EOMI. Head: Atraumatic. Nose: No congestion/rhinnorhea. Neck: No stridor.   Cardiovascular: Normal rate, regular rhythm. Grossly normal heart sounds.  Good peripheral circulation. Respiratory: Normal respiratory effort.  No retractions. Lungs CTAB. Musculoskeletal: Left great toe with very thick fungal involvement and subungual hematoma.  Soft tissue swelling present both great and 2nd digit. No gross deformity. Motor sensory function intact.  Skin intact.   Neurologic:  Normal speech and language. No gross focal neurologic deficits are appreciated. No gait instability. Skin:  Skin is warm, dry and intact. No rash noted. Psychiatric: Mood and affect are normal. Speech and behavior are  normal.  ____________________________________________   LABS (all labs ordered are listed, but only abnormal results are displayed)  Labs Reviewed - No data to display  RADIOLOGY  Left foot xray per radiologist: IMPRESSION:  Chronic changes without acute abnormality.     I, Johnn Hai, personally viewed and evaluated these images (plain radiographs) as part of my medical decision making, as well as reviewing the written report by the radiologist.  ____________________________________________   PROCEDURES  Procedure(s) performed: None  Procedures  Critical Care performed: No  ____________________________________________   INITIAL IMPRESSION / ASSESSMENT AND PLAN / ED COURSE  Pertinent labs & imaging results that were available during my care of the patient were reviewed by me and considered in my medical decision making (see chart for details).  Patient will follow up with his regular doctor if any continued problems.  Ice and elevate as needed for swelling.  Wear wooden shoe for protection.  Tylenol or ibuprofen prn pain.  Norco if needed for moderate to severe pain.      ____________________________________________   FINAL CLINICAL IMPRESSION(S) / ED DIAGNOSES  Final diagnoses:  Contusion of left foot, initial encounter  Subungual hematoma of great toe of left foot, initial encounter      NEW MEDICATIONS STARTED DURING THIS VISIT:  Discharge Medication List as of 09/11/2016 12:17 PM    START taking these medications   Details  HYDROcodone-acetaminophen (NORCO/VICODIN) 5-325 MG tablet Take 1 tablet by mouth every 4 (four) hours as needed for moderate pain., Starting Fri 09/11/2016, Print         Note:  This document was prepared using Dragon voice recognition software and may include unintentional dictation errors.    Johnn Hai, PA-C 09/11/16 [place], MD 09/11/16 1600

## 2016-09-14 ENCOUNTER — Inpatient Hospital Stay
Admit: 2016-09-14 | Discharge: 2016-09-14 | Disposition: A | Discharge status: Home / Self Care | Payer: MEDICARE | Attending: Emergency Medicine

## 2016-09-14 ENCOUNTER — Emergency Department: Admit: 2016-09-14 | Payer: MEDICARE | Primary: Emergency Medicine

## 2016-09-14 DIAGNOSIS — S90222A Contusion of left lesser toe(s) with damage to nail, initial encounter: Secondary | ICD-10-CM

## 2016-09-14 MED ORDER — CEPHALEXIN 500 MG CAP
500 mg | ORAL_CAPSULE | Freq: Two times a day (BID) | ORAL | 0 refills | Status: AC
Start: 2016-09-14 — End: 2016-09-21

## 2016-09-14 NOTE — ED Provider Notes (Addendum)
[facility] Care  Emergency Department Treatment Report    Patient: [name] [name] Age: 71 y.o. Sex: male    Date of Birth: Aug 27, 1945 Admit Date: 09/14/2016 PCP: Jannett Celestine, MD   MRN: [id number]  CSN: [id number]  Attending: Jerre Simon, MD   Room: OTF/OTF Time Dictated: 10:08 AM APP:  Terressa Koyanagi       Chief Complaint    Left great toe infection  History of Present Illness   71 y.o. male presents for evaluation of his left great toe.  He states 4 days ago he actually dropped a 2 x 4 piece of wood on his left great toe.  He was evaluated at an emergency department in [facility] and was told he did not have a fracture.  Since then he has developed more bleeding underneath the skin at the base of the cuticle and some redness surrounding the distal toe.  He denies any severe pain or numbness or tingling.  He has been wearing his hard sole sandal.  Denies any drainage.  He has onychomycosis of his nail and the nail feels loosened.  No active bleeding.  No other trauma or injuries reported.  When he does have some pain it is a dull throbbing ache.  He took some Tylenol this morning which has helped    Review of Systems   Review of Systems   Constitutional: Negative for chills, fever and malaise/fatigue.   Respiratory: Negative for shortness of breath.    Cardiovascular: Negative for leg swelling.   Musculoskeletal: Negative for joint pain.   Skin: Negative for rash.   Neurological: Negative for sensory change and focal weakness.   Endo/Heme/Allergies: Does not bruise/bleed easily.       Past Medical/Surgical History   Hypertension  Social History     Social History     Social History   ??? Marital status: MARRIED     Spouse name: N/A   ??? Number of children: N/A   ??? Years of education: N/A     Social History Main Topics   ??? Smoking status: Not on file   ??? Smokeless tobacco: Not on file   ??? Alcohol use Not on file   ??? Drug use: Not on file   ??? Sexual activity: Not on file      Other Topics Concern   ??? Not on file     Social History Narrative   Lives in [place]    Family History   No family history on file.    Home Medications     No current facility-administered medications for this encounter.      Current Outpatient Prescriptions   Medication Sig   ??? cephALEXin (KEFLEX) 500 mg capsule Take 1 Cap by mouth two (2) times a day for 7 days.       Allergies     Allergies   Allergen Reactions   ??? Penicillins Rash       Physical Exam   ED Triage Vitals   ED Encounter Vitals Group      BP 09/14/16 0922 171/88      Pulse (Heart Rate) 09/14/16 0922 68      Resp Rate 09/14/16 0922 18      Temp 09/14/16 0922 98 ??F (36.7 ??C)      Temp src --       O2 Sat (%) 09/14/16 0922 100 %      Weight 09/14/16 0913 199 lb  Height 09/14/16 0913      Physical Exam   Constitutional: He is well-developed, well-nourished, and in no distress.   Pulmonary/Chest: Effort normal. No respiratory distress.   Musculoskeletal:   Left great toe: Entire toe covered in onychomycosis.  Nail is lifted at the distal tip.  With pressure the nail bounces and is spongy.  Entire distal toe is swollen and mildly erythematous.  Tender over the base of the cuticle that is edematous and has a hematoma with subungual hematoma at the very base of the toenail.  There is bright red erythema surrounding the hematoma.  No streaking.  No tenderness over the MTP or PIP joint.  Patient is able to flex and extend his toe.  No tenderness over the metatarsals proximally or over the ankle.  No evidence of foreign body on the sole of the foot       Impression and Management Plan   71 year old male presenting with left great toe pain and trauma with developing infection.  Differentials include osteomyelitis, subungual hematoma, paronychia, localized infection, fungal infection.  We'll order an x-ray to assess for osteomyelitis, fluid collections/hematoma, gas formation underneath the skin.    Diagnostic Studies   Lab:    No results found for this or any previous visit (from the past 12 hour(s)).    Imaging:    Xr Great Toe Lt Min 2 V    Result Date: 09/14/2016  Indication: Injury with pain 3 views left great toe show no fracture or destructive bony lesion. No soft tissue gas. Mild narrowing first MTP joint with osteophyte formation. Vascular calcifications are seen in the soft tissues.     IMPRESSION: Degenerative joint disease. Peripheral vascular disease. No acute abnormality seen.       Procedure note: please read ED course below    ED Course     ED Course   Dr. Clinton Sawyer prepped skin with betadine and used an 11 blade to make a small incision at the base of the left toe cuticle.  A large amount of blood drained from the hematoma and patient had immediate relief of pain and pressure.  No purulent drainage.  PA Walgren:  Cleaned toe of betadine and applied bacitracin dressing and toe bandage.      Medical Decision Making   Discharge patient home in stable condition follow-up with Dr. Lisette Abu, podiatry    Final Diagnosis       ICD-10-CM ICD-9-CM   1. Cellulitis of toe of left foot L03.032 681.10   2. Subungual hematoma of toe of left foot, initial encounter S90.222A 924.3   3. Onychomycosis B35.1 110.1       Disposition   The patient is discharged with verbal and written instructions and referral for ongoing care with Dr. Lisette Abu, podiatry  The patient is aware that they may return at any time for new or worsening symptoms.  Patient's out cellulitis will be treated with Keflex.  Reviewed allergies.  Patient to return for increased redness, swelling, abscess formation, red streaks, systemic symptoms including fever or chills, new concerns or symptoms.    Discharge Medication List as of 09/14/2016 11:11 AM      START taking these medications    Details   cephALEXin (KEFLEX) 500 mg capsule Take 1 Cap by mouth two (2) times a day for 7 days., Print, Disp-14 Cap, R-0            The patient was personally evaluated by myself and Dr. Chelsea Primus  who agrees with the above assessment and plan.    Terressa Koyanagi PA-C  September 14, 2016    My signature above authenticates this document and my orders, the final ??  diagnosis (es), discharge prescription (s), and instructions in the Epic ??  record.  If you have any questions please contact [phone].  ??  Nursing notes have been reviewed by the physician/ advanced practice ??  Clinician.

## 2016-09-14 NOTE — ED Notes (Signed)
11:26 AM  09/14/16     Discharge instructions given to patient (name) with verbalization of understanding. Patient accompanied by patient.  Patient discharged with the following prescriptions   Current Discharge Medication List      START taking these medications    Details   cephALEXin (KEFLEX) 500 mg capsule Take 1 Cap by mouth two (2) times a day for 7 days.  Qty: 14 Cap, Refills: 0          . Patient discharged to Home (destination).      Dewitt Hoes, RN

## 2016-09-14 NOTE — ED Triage Notes (Signed)
Pt dropped a 2x4 on left great toe last Thursday. Pt went to ED [facility] in [place], [place] and had x-ray of toe; no break of toe was found. Pt worried that toe is now infected.

## 2017-07-04 ENCOUNTER — Emergency Department
Admission: EM | Admit: 2017-07-04 | Discharge: 2017-07-04 | Disposition: A | Discharge status: Home / Self Care | Payer: Medicare Other | Attending: Emergency Medicine | Admitting: Emergency Medicine

## 2017-07-04 ENCOUNTER — Emergency Department: Payer: Medicare Other

## 2017-07-04 ENCOUNTER — Encounter: Payer: Self-pay | Admitting: Emergency Medicine

## 2017-07-04 ENCOUNTER — Other Ambulatory Visit: Payer: Self-pay

## 2017-07-04 DIAGNOSIS — S60121A Contusion of right index finger with damage to nail, initial encounter: Secondary | ICD-10-CM | POA: Diagnosis not present

## 2017-07-04 DIAGNOSIS — S6991XA Unspecified injury of right wrist, hand and finger(s), initial encounter: Secondary | ICD-10-CM | POA: Diagnosis present

## 2017-07-04 DIAGNOSIS — I10 Essential (primary) hypertension: Secondary | ICD-10-CM | POA: Diagnosis not present

## 2017-07-04 DIAGNOSIS — W230XXA Caught, crushed, jammed, or pinched between moving objects, initial encounter: Secondary | ICD-10-CM | POA: Diagnosis not present

## 2017-07-04 DIAGNOSIS — Y999 Unspecified external cause status: Secondary | ICD-10-CM | POA: Diagnosis not present

## 2017-07-04 DIAGNOSIS — Z79899 Other long term (current) drug therapy: Secondary | ICD-10-CM | POA: Diagnosis not present

## 2017-07-04 DIAGNOSIS — Y9289 Other specified places as the place of occurrence of the external cause: Secondary | ICD-10-CM | POA: Insufficient documentation

## 2017-07-04 DIAGNOSIS — Z7982 Long term (current) use of aspirin: Secondary | ICD-10-CM | POA: Insufficient documentation

## 2017-07-04 DIAGNOSIS — Y9389 Activity, other specified: Secondary | ICD-10-CM | POA: Insufficient documentation

## 2017-07-04 DIAGNOSIS — S6010XA Contusion of unspecified finger with damage to nail, initial encounter: Secondary | ICD-10-CM

## 2017-07-04 DIAGNOSIS — S6000XA Contusion of unspecified finger without damage to nail, initial encounter: Secondary | ICD-10-CM

## 2017-07-04 NOTE — ED Provider Notes (Signed)
[facility] Emergency Department Provider Note  ____________________________________________   First MD Initiated Contact with Patient 07/04/17 1443     (approximate)  I have reviewed the triage vital signs and the nursing notes.   HISTORY  Chief Complaint Finger Injury    HPI Ruben Willis is a 72 y.o. male complains of right index finger pain.  He states it got caught in a wood splitter between the log and splinter.  This happened Friday.  Finger is bruised and swollen.  He has skin tear on side.  He is mainly concerned about the bruising underneath the nail.  He denies any other injuries.  His tetanus is up-to-date  Past Medical History:  Diagnosis Date  . A-fib ([place])   . Hypercholesteremia   . Hyperlipidemia   . Hypertension   . Pulmonary embolism (HCC)     There are no active problems to display for this patient.   Past Surgical History:  Procedure Laterality Date  . APPENDECTOMY    . COSMETIC SURGERY     eye lids  . NASAL SEPTUM SURGERY      Prior to Admission medications   Medication Sig Start Date End Date Taking? Authorizing Provider  aspirin 325 MG tablet Take 325 mg by mouth daily.    [provider]  candesartan (ATACAND) 8 MG tablet Take 16 mg by mouth daily.    [provider]  celecoxib (CELEBREX) 200 MG capsule Take 200 mg by mouth 2 (two) times daily.    [provider]  esomeprazole (NEXIUM) 40 MG capsule Take 40 mg by mouth daily at 12 noon.    [provider]  ezetimibe-simvastatin (VYTORIN) 10-20 MG per tablet Take 1 tablet by mouth daily.    [provider]  HYDROcodone-acetaminophen (NORCO/VICODIN) 5-325 MG tablet Take 1 tablet by mouth every 4 (four) hours as needed for moderate pain. 09/11/16   Johnn Hai, PA-C  levocetirizine (XYZAL) 5 MG tablet Take 5 mg by mouth every evening.    [provider]  propafenone (RYTHMOL SR) 325 MG 12 hr capsule Take 325  mg by mouth 2 (two) times daily.    [provider]    Allergies Penicillins  History reviewed. No pertinent family history.  Social History Social History   Tobacco Use  . Smoking status: Never Smoker  . Smokeless tobacco: Never Used  Substance Use Topics  . Alcohol use: No  . Drug use: No    Review of Systems  Constitutional: No fever/chills Eyes: No visual changes. ENT: No sore throat. Respiratory: Denies cough Genitourinary: Negative for dysuria. Musculoskeletal: Negative for back pain.  Positive for right index finger pain Skin: Negative for rash.  Positive for cut to the right index finger, positive for bruising under the nail    ____________________________________________   PHYSICAL EXAM:  VITAL SIGNS: ED Triage Vitals [07/04/17 1410]  Enc Vitals Group     BP (!) 153/81     Pulse Rate 83     Resp 18     Temp (!) 97.4 F (36.3 C)     Temp Source Oral     SpO2 96 %     Weight 201 lb (91.2 kg)     Height 5\' 9"  (1.753 m)     Head Circumference      Peak Flow      Pain Score      Pain Loc      Pain Edu?  Excl. in [place]?     Constitutional: Alert and oriented. Well appearing and in no acute distress. Eyes: Conjunctivae are normal.  Head: Atraumatic. Nose: No congestion/rhinnorhea. Mouth/Throat: Mucous membranes are moist.   Cardiovascular: Normal rate, regular rhythm. Respiratory: Normal respiratory effort.  No retractions GU: deferred Musculoskeletal: FROM all extremities, warm and well perfused, right index finger has a subungual hematoma, and open wounds and to the side of the finger.  He has decreased range of motion with flexion and extension at the mip, he is neurovascularly intact Neurologic:  Normal speech and language.  Skin:  Skin is warm, dry abrasion at the side of the right index finger psychiatric: Mood and affect are normal. Speech and behavior are normal.  ____________________________________________   LABS (all labs  ordered are listed, but only abnormal results are displayed)  Labs Reviewed - No data to display ____________________________________________   ____________________________________________  RADIOLOGY  X-ray of the right index finger is negative for fracture  ____________________________________________   PROCEDURES  Procedure(s) performed: 18-gauge needle was used to drill a hole in the nail.  Small amount of blood was expelled.  Patient tolerated procedure well  Procedures    ____________________________________________   INITIAL IMPRESSION / ASSESSMENT AND PLAN / ED COURSE  Pertinent labs & imaging results that were available during my care of the patient were reviewed by me and considered in my medical decision making (see chart for details).  Patient is a 72 year old male complaining of right index finger pain.  He got it caught between a log in the log splitter 2 days ago, this Friday.  He is concerned about the bruising under the nail.  On physical exam there is a subungual hematoma.  The right index finger is tender to palpation and there is an abrasion on the side  X-ray of the index finger is negative for fracture    ----------------------------------------- 3:26 PM on 07/04/2017 -----------------------------------------  An 18-gauge needle was used to remove the subungual hematoma.  Patient tolerated procedure well.  X-ray results were discussed with the patient and his wife.  The nurses were instructed to apply a dressing to the finger and then buddy tape the fingers together.  He is to keep the fingers buddy taped for several days.  He is to remove the bandage and change it daily.  Patient is wife state they understand.  He was discharged in stable condition  As part of my medical decision making, I reviewed the following data within the [place] notes reviewed and incorporated, Radiograph reviewed x-ray of the right index finger is  negative for fracture, Notes from prior ED visits and [place] Controlled Substance Database  ____________________________________________   FINAL CLINICAL IMPRESSION(S) / ED DIAGNOSES  Final diagnoses:  Subungual hematoma of digit of hand, initial encounter  Contusion of finger without damage to nail, unspecified finger, initial encounter      NEW MEDICATIONS STARTED DURING THIS VISIT:  New Prescriptions   No medications on file     Note:  This document was prepared using Dragon voice recognition software and may include unintentional dictation errors.    Versie Starks, PA-C 07/04/17 1527    Lisa Roca, MD 07/07/17 [phone]

## 2017-07-04 NOTE — ED Notes (Signed)
Patient ambulatory to lobby with steady gait and NAD noted. Verbalized understanding of discharge instructions and follow-up care.  

## 2017-07-04 NOTE — ED Notes (Signed)
Patient's finger dressed and taped by Beverlee Nims, EDT. Patient verbalized understanding of dressing instructions.

## 2017-07-04 NOTE — ED Triage Notes (Signed)
Pt presents to ED via POV c/o injury to R index finger. Pt states he got finger caught between log and another object while splitting logs yesterday. Tip of finger is swollen, bruised, skin tear to medial side of finger tip. Pt states no pain at this time, has been using neosporin and band-aids at home. Came in because of concern for blood blister under nail.

## 2017-07-04 NOTE — Discharge Instructions (Signed)
Follow-up with your regular doctor if you are not better in 3-5 days.  Apply dressing daily.  Keep your fingers buddy taped for 3-5 days.  This should help with the swelling in your hand.  You can take over-the-counter Tylenol or ibuprofen as needed for pain.  Elevate and ice will also help.  Return to the emergency department if you are worsening or if you see any signs of infection.

## 2017-10-19 ENCOUNTER — Encounter: Payer: Self-pay | Admitting: Emergency Medicine

## 2017-10-19 ENCOUNTER — Emergency Department
Admission: EM | Admit: 2017-10-19 | Discharge: 2017-10-19 | Disposition: A | Discharge status: Home / Self Care | Payer: Medicare Other | Attending: Emergency Medicine | Admitting: Emergency Medicine

## 2017-10-19 DIAGNOSIS — L03116 Cellulitis of left lower limb: Secondary | ICD-10-CM | POA: Insufficient documentation

## 2017-10-19 DIAGNOSIS — Z79899 Other long term (current) drug therapy: Secondary | ICD-10-CM | POA: Diagnosis not present

## 2017-10-19 DIAGNOSIS — M79662 Pain in left lower leg: Secondary | ICD-10-CM | POA: Diagnosis present

## 2017-10-19 DIAGNOSIS — Z7982 Long term (current) use of aspirin: Secondary | ICD-10-CM | POA: Insufficient documentation

## 2017-10-19 DIAGNOSIS — I10 Essential (primary) hypertension: Secondary | ICD-10-CM | POA: Insufficient documentation

## 2017-10-19 LAB — BASIC METABOLIC PANEL
ANION GAP: 5 (ref 5–15)
BUN: 20 mg/dL (ref 6–20)
CALCIUM: 8.9 mg/dL (ref 8.9–10.3)
CO2: 26 mmol/L (ref 22–32)
Chloride: 107 mmol/L (ref 101–111)
Creatinine, Ser: 1 mg/dL (ref 0.61–1.24)
GLUCOSE: 83 mg/dL (ref 65–99)
Potassium: 4.3 mmol/L (ref 3.5–5.1)
SODIUM: 138 mmol/L (ref 135–145)

## 2017-10-19 LAB — CBC WITH DIFFERENTIAL/PLATELET
BASOS ABS: 0 10*3/uL (ref 0–0.1)
BASOS PCT: 1 %
Eosinophils Absolute: 0.2 10*3/uL (ref 0–0.7)
Eosinophils Relative: 4 %
HEMATOCRIT: 37.7 % — AB (ref 40.0–52.0)
Hemoglobin: 12.8 g/dL — ABNORMAL LOW (ref 13.0–18.0)
Lymphocytes Relative: 35 %
Lymphs Abs: 1.6 10*3/uL (ref 1.0–3.6)
MCH: 30.4 pg (ref 26.0–34.0)
MCHC: 34 g/dL (ref 32.0–36.0)
MCV: 89.5 fL (ref 80.0–100.0)
MONO ABS: 0.4 10*3/uL (ref 0.2–1.0)
Monocytes Relative: 9 %
NEUTROS ABS: 2.3 10*3/uL (ref 1.4–6.5)
NEUTROS PCT: 51 %
Platelets: 275 10*3/uL (ref 150–440)
RBC: 4.21 MIL/uL — ABNORMAL LOW (ref 4.40–5.90)
RDW: 14 % (ref 11.5–14.5)
WBC: 4.6 10*3/uL (ref 3.8–10.6)

## 2017-10-19 MED ORDER — SULFAMETHOXAZOLE-TRIMETHOPRIM 800-160 MG PO TABS: 1.0000 | ORAL_TABLET | Freq: Two times a day (BID) | ORAL | 0 refills | Status: DC

## 2017-10-19 NOTE — Discharge Instructions (Addendum)
Please follow up with your primary care provider in 2-3 days. Elevate your left leg most of the day for the next 48 hours. Return to the ER for symptoms that change or worsen or for new concerns if unable to see your primary care provider.

## 2017-10-19 NOTE — ED Provider Notes (Signed)
[facility] Emergency Department Provider Note  ____________________________________________  Time seen: Approximately 3:02 PM  I have reviewed the triage vital signs and the nursing notes.   HISTORY  Chief Complaint Wound Check   HPI Ruben Willis is a 72 y.o. male who presents to the emergency department for treatment and evaluation of pain and swelling in the left lower extremity. He states that he was walking by a tall piece of PVC pipe that was standing up against the wall and it fell over and hit the back of his left leg. He had a "divit" in the back of his leg afterward. He cleaned it and put a bandaid over it. He and his wife went to the [facility] and it seemed fine until Sunday when he noticed that his leg was swelling and there was some bruising to the lower part of his foot. No alleviating measures attempted for this complaint.  Past Medical History:  Diagnosis Date  . A-fib ([place])   . Hypercholesteremia   . Hyperlipidemia   . Hypertension   . Pulmonary embolism (HCC)     There are no active problems to display for this patient.   Past Surgical History:  Procedure Laterality Date  . APPENDECTOMY    . COSMETIC SURGERY     eye lids  . NASAL SEPTUM SURGERY      Prior to Admission medications   Medication Sig Start Date End Date Taking? Authorizing Provider  aspirin 325 MG tablet Take 325 mg by mouth daily.    [provider]  candesartan (ATACAND) 8 MG tablet Take 16 mg by mouth daily.    [provider]  celecoxib (CELEBREX) 200 MG capsule Take 200 mg by mouth 2 (two) times daily.    [provider]  esomeprazole (NEXIUM) 40 MG capsule Take 40 mg by mouth daily at 12 noon.    [provider]  ezetimibe-simvastatin (VYTORIN) 10-20 MG per tablet Take 1 tablet by mouth daily.    [provider]  HYDROcodone-acetaminophen (NORCO/VICODIN) 5-325 MG tablet Take 1 tablet by mouth every 4 (four) hours as  needed for moderate pain. 09/11/16   Johnn Hai, PA-C  levocetirizine (XYZAL) 5 MG tablet Take 5 mg by mouth every evening.    [provider]  propafenone (RYTHMOL SR) 325 MG 12 hr capsule Take 325 mg by mouth 2 (two) times daily.    [provider]  sulfamethoxazole-trimethoprim (BACTRIM DS,SEPTRA DS) 800-160 MG tablet Take 1 tablet by mouth 2 (two) times daily. 10/19/17   Sherrie George B, FNP    Allergies Penicillins  No family history on file.  Social History Social History   Tobacco Use  . Smoking status: Never Smoker  . Smokeless tobacco: Never Used  Substance Use Topics  . Alcohol use: No  . Drug use: No    Review of Systems  Constitutional: Negative for fever. Respiratory: Negative for cough or shortness of breath.  Musculoskeletal: Positive for myalgias Skin: Positive for wound to the left calf. Neurological: Negative for numbness or paresthesias. ____________________________________________   PHYSICAL EXAM:  VITAL SIGNS: ED Triage Vitals  Enc Vitals Group     BP 10/19/17 1121 (!) 159/80     Pulse Rate 10/19/17 1121 61     Resp 10/19/17 1121 18     Temp 10/19/17 1121 98.1 F (36.7 C)     Temp Source 10/19/17 1121 Oral     SpO2 10/19/17 1121 97 %     Weight  10/19/17 1122 205 lb (93 kg)     Height 10/19/17 1122 5\' 9"  (1.753 m)     Head Circumference --      Peak Flow --      Pain Score 10/19/17 1122 3     Pain Loc --      Pain Edu? --      Excl. in [place]? --      Constitutional: Well appearing. Eyes: Conjunctivae are clear without discharge or drainage. Nose: No rhinorrhea noted. Mouth/Throat: Airway is patent.  Neck: No stridor. Unrestricted range of motion observed.  Cardiovascular: Capillary refill is <3 seconds.  Respiratory: Respirations are even and unlabored.. Musculoskeletal: Unrestricted range of motion observed. Neurologic: Awake, alert, and oriented x 4.  Skin:  Scabbed, linear area on the left calf with some  surrounding erythema. Diffuse lower extremity non-pitting edema with staged ecchymosis on the lateral aspect of the foot. ___________________________________________   LABS (all labs ordered are listed, but only abnormal results are displayed)  Labs Reviewed  CBC WITH DIFFERENTIAL/PLATELET - Abnormal; Notable for the following components:      Result Value   RBC 4.21 (*)    Hemoglobin 12.8 (*)    HCT 37.7 (*)    All other components within normal limits  BASIC METABOLIC PANEL   ____________________________________________  EKG  Not indicated. ____________________________________________  RADIOLOGY  Not indicated ____________________________________________   PROCEDURES  Procedures ____________________________________________   INITIAL IMPRESSION / ASSESSMENT AND PLAN / ED COURSE  [name] is a 72 y.o. male who presents to the emergency department for treatment and evaluation of injury to the left calf that happened several days ago. Labs are reassuring. He will start Bactrim to treat an early cellulitis. An ace bandage was applied below the wound to help with the swelling. He is to follow up with his PCP later this week for a recheck or return to the ER for symptoms of concern.  Medications - No data to display   Pertinent labs & imaging results that were available during my care of the patient were reviewed by me and considered in my medical decision making (see chart for details). ____________________________________________   FINAL CLINICAL IMPRESSION(S) / ED DIAGNOSES  Final diagnoses:  Cellulitis of left lower extremity    ED Discharge Orders        Ordered    sulfamethoxazole-trimethoprim (BACTRIM DS,SEPTRA DS) 800-160 MG tablet  2 times daily     10/19/17 1303    Compression stockings  Status:  Canceled     10/19/17 1303       Note:  This document was prepared using Dragon voice recognition software and may include unintentional  dictation errors.    Victorino Dike, FNP 10/19/17 1525    Lavonia Drafts, MD 10/21/17 [phone]

## 2017-10-19 NOTE — ED Notes (Signed)
See triage note  Presents with swelling to left lower leg  States he was hit by a pipe last Thursday  Has wound to back of leg  States he has been dressing the wound  But he is concerned with the swelling to leg  Ambulates well

## 2017-10-19 NOTE — ED Triage Notes (Signed)
Patient presents to the ED with a wound to the back of his left calf.  Patient states he was working with heavy pipe and he had a piece sitting on a table, he walked by it and the pipe came down and fell on his leg.  Patient states this occurred on Thursday.  Patient states he bandaged the wound and then noticed swelling to left leg and redness to left foot on SUnday.  Wound appears to be healing well.  Leg does appear swollen, no pitting.

## 2018-04-03 ENCOUNTER — Emergency Department: Payer: Medicare Other

## 2018-04-03 ENCOUNTER — Other Ambulatory Visit: Payer: Self-pay

## 2018-04-03 ENCOUNTER — Emergency Department
Admission: EM | Admit: 2018-04-03 | Discharge: 2018-04-03 | Disposition: A | Discharge status: Home / Self Care | Payer: Medicare Other | Attending: Emergency Medicine | Admitting: Emergency Medicine

## 2018-04-03 ENCOUNTER — Encounter: Payer: Self-pay | Admitting: Emergency Medicine

## 2018-04-03 DIAGNOSIS — M79651 Pain in right thigh: Secondary | ICD-10-CM

## 2018-04-03 DIAGNOSIS — Z7982 Long term (current) use of aspirin: Secondary | ICD-10-CM | POA: Diagnosis not present

## 2018-04-03 DIAGNOSIS — S7011XA Contusion of right thigh, initial encounter: Secondary | ICD-10-CM | POA: Diagnosis not present

## 2018-04-03 DIAGNOSIS — Y9389 Activity, other specified: Secondary | ICD-10-CM | POA: Insufficient documentation

## 2018-04-03 DIAGNOSIS — Y998 Other external cause status: Secondary | ICD-10-CM | POA: Diagnosis not present

## 2018-04-03 DIAGNOSIS — I10 Essential (primary) hypertension: Secondary | ICD-10-CM | POA: Insufficient documentation

## 2018-04-03 DIAGNOSIS — W228XXA Striking against or struck by other objects, initial encounter: Secondary | ICD-10-CM | POA: Diagnosis not present

## 2018-04-03 DIAGNOSIS — Y929 Unspecified place or not applicable: Secondary | ICD-10-CM | POA: Insufficient documentation

## 2018-04-03 DIAGNOSIS — M25451 Effusion, right hip: Secondary | ICD-10-CM

## 2018-04-03 DIAGNOSIS — Z79899 Other long term (current) drug therapy: Secondary | ICD-10-CM | POA: Diagnosis not present

## 2018-04-03 DIAGNOSIS — S79921A Unspecified injury of right thigh, initial encounter: Secondary | ICD-10-CM | POA: Diagnosis present

## 2018-04-03 NOTE — ED Provider Notes (Signed)
[facility] Emergency Department Provider Note ____________________________________________  Time seen: 1400  I have reviewed the triage vital signs and the nursing notes.  HISTORY  Chief Complaint  Leg Pain   HPI Ruben Willis is a 72 y.o. male presents to the ER today with complaint of right thigh pain, swelling and bruising.  He reports approximately 1 week ago a piece of plywood fell on his right side.  EMS was called to the scene, he was evaluated but did not go to the ER for evaluation.  He reports 3 days ago he was walking his dog, lost his balance and did a split.  Since that time he has noticed bruising and swelling to the posterior thigh.  He does have pain with ambulation.  He reports he is not on blood thinners.  He has not taken anything over-the-counter for symptoms.  Past Medical History:  Diagnosis Date  . A-fib ([place])   . Hypercholesteremia   . Hyperlipidemia   . Hypertension   . Pulmonary embolism (HCC)     There are no active problems to display for this patient.   Past Surgical History:  Procedure Laterality Date  . APPENDECTOMY    . COSMETIC SURGERY     eye lids  . NASAL SEPTUM SURGERY      Prior to Admission medications   Medication Sig Start Date End Date Taking? Authorizing Provider  aspirin 325 MG tablet Take 325 mg by mouth daily.    [provider]  candesartan (ATACAND) 8 MG tablet Take 16 mg by mouth daily.    [provider]  celecoxib (CELEBREX) 200 MG capsule Take 200 mg by mouth 2 (two) times daily.    [provider]  esomeprazole (NEXIUM) 40 MG capsule Take 40 mg by mouth daily at 12 noon.    [provider]  ezetimibe-simvastatin (VYTORIN) 10-20 MG per tablet Take 1 tablet by mouth daily.    [provider]  HYDROcodone-acetaminophen (NORCO/VICODIN) 5-325 MG tablet Take 1 tablet by mouth every 4 (four) hours as needed for moderate pain. 09/11/16   Johnn Hai,  PA-C  levocetirizine (XYZAL) 5 MG tablet Take 5 mg by mouth every evening.    [provider]  propafenone (RYTHMOL SR) 325 MG 12 hr capsule Take 325 mg by mouth 2 (two) times daily.    [provider]  sulfamethoxazole-trimethoprim (BACTRIM DS,SEPTRA DS) 800-160 MG tablet Take 1 tablet by mouth 2 (two) times daily. 10/19/17   Triplett, Johnette Abraham B, FNP    Allergies Morphine; Nitroglycerin; and Penicillins  History reviewed. No pertinent family history.  Social History Social History   Tobacco Use  . Smoking status: Never Smoker  . Smokeless tobacco: Never Used  Substance Use Topics  . Alcohol use: No  . Drug use: No    Review of Systems  Constitutional: Negative for fever. Musculoskeletal: Positive for right upper leg pain and swelling. Skin: Positive for bruising, right thigh Neurological: Negative for tingling, focal weakness or numbness. ____________________________________________  PHYSICAL EXAM:  VITAL SIGNS: ED Triage Vitals  Enc Vitals Group     BP 04/03/18 1257 (!) 151/89     Pulse Rate 04/03/18 1257 76     Resp 04/03/18 1257 18     Temp 04/03/18 1257 98.5 F (36.9 C)     Temp Source 04/03/18 1257 Oral     SpO2 04/03/18 1257 94 %     Weight 04/03/18 1259 203 lb (92.1 kg)  Height 04/03/18 1259 5\' 9"  (1.753 m)     Head Circumference --      Peak Flow --      Pain Score 04/03/18 1258 8     Pain Loc --      Pain Edu? --      Excl. in [place]? --     Constitutional: Alert and oriented. Well appearing and in no distress. Cardiovascular: Normal rate, regular rhythm.  Radial pulses 2+ bilaterally. Respiratory: Normal respiratory effort. No wheezes/rales/rhonchi. Musculoskeletal: Normal AB duction and abduction of the right hip.  Normal flexion, extension, internal and external rotation of the right hip.  No pain with palpation of the hip.  He has swelling noted over the right lateral distal thigh.  He has bruising over the entire posterior hamstring.   Right upper leg with 1+ swelling. Neurologic: Station intact to bilateral upper extremities. Skin: Bruising noted over the entire posterior hamstring ____________________________________________   RADIOLOGY  Imaging Orders     DG Femur Min 2 Views Right  IMPRESSION: No acute abnormality noted. ____________________________________________    INITIAL IMPRESSION / ASSESSMENT AND PLAN / ED COURSE  Right Thigh Pain, Swelling, Bruising:  Xray femur negative for fracture Likely hamstring tear ACE wrap for compression to help reduce swelling Encouraged stretching Alternate heat and ice Encouraged elevation ____________________________________________  FINAL CLINICAL IMPRESSION(S) / ED DIAGNOSES  Final diagnoses:  Right thigh pain  Swelling of joint of pelvic region or thigh, right  Contusion of right thigh, initial encounter      Jearld Fenton, NP 04/03/18 1509    Lisa Roca, MD 04/03/18 [phone]

## 2018-04-03 NOTE — ED Triage Notes (Addendum)
Pt arrived via POV with reports of right leg pain after being pulled by his dog a few days ago.  Pt states bruising is present-denies blood thinner use.  Pt ambulatory without difficulty, but states it is painful at times to walk.

## 2018-04-03 NOTE — ED Notes (Signed)
Pain  And  Swelling with bruising noted  To r  Upper  Thigh   Pt reports  He was walking his  Dog and   He did a split and  Injured  The leg  3  Days  Pt  Reports  Also  Injured  His  r knee  1  Week  Was  Struck by plywood   Pt has  Been ambulating since  Both injury  Pain on weight   Bearing

## 2018-04-03 NOTE — Discharge Instructions (Addendum)
You are being diagnosed with right thigh pain, swelling and bruising.  The x-ray of your right femur is negative for acute fracture.  You might have a slight hamstring tear.  We have wrapped your right thigh and an Ace wrap to help reduce swelling.  Continue Celebrex as prescribed.  You may alternate heat and ice to help with pain and swelling.  Stretching will also be helpful.  It will likely take 4 to 6 weeks for bruising to completely resolved.

## 2018-07-20 DIAGNOSIS — Z79899 Other long term (current) drug therapy: Secondary | ICD-10-CM | POA: Insufficient documentation

## 2018-08-10 DIAGNOSIS — R002 Palpitations: Secondary | ICD-10-CM | POA: Insufficient documentation

## 2018-08-10 DIAGNOSIS — R079 Chest pain, unspecified: Secondary | ICD-10-CM | POA: Insufficient documentation

## 2018-08-10 DIAGNOSIS — R0602 Shortness of breath: Secondary | ICD-10-CM | POA: Insufficient documentation

## 2018-08-10 DIAGNOSIS — I459 Conduction disorder, unspecified: Secondary | ICD-10-CM | POA: Insufficient documentation

## 2018-08-17 HISTORY — PX: PACEMAKER PLACEMENT: SHX43

## 2019-01-04 DIAGNOSIS — Z950 Presence of cardiac pacemaker: Secondary | ICD-10-CM | POA: Insufficient documentation

## 2019-04-11 ENCOUNTER — Emergency Department: Payer: Medicare Other

## 2019-04-11 ENCOUNTER — Emergency Department
Admission: EM | Admit: 2019-04-11 | Discharge: 2019-04-11 | Disposition: A | Discharge status: Home / Self Care | Payer: Medicare Other | Attending: Emergency Medicine | Admitting: Emergency Medicine

## 2019-04-11 ENCOUNTER — Encounter: Payer: Self-pay | Admitting: Emergency Medicine

## 2019-04-11 ENCOUNTER — Other Ambulatory Visit: Payer: Self-pay

## 2019-04-11 DIAGNOSIS — M779 Enthesopathy, unspecified: Secondary | ICD-10-CM | POA: Diagnosis not present

## 2019-04-11 DIAGNOSIS — I10 Essential (primary) hypertension: Secondary | ICD-10-CM | POA: Diagnosis not present

## 2019-04-11 DIAGNOSIS — Z950 Presence of cardiac pacemaker: Secondary | ICD-10-CM | POA: Diagnosis not present

## 2019-04-11 DIAGNOSIS — Z7982 Long term (current) use of aspirin: Secondary | ICD-10-CM | POA: Diagnosis not present

## 2019-04-11 DIAGNOSIS — Z79899 Other long term (current) drug therapy: Secondary | ICD-10-CM | POA: Diagnosis not present

## 2019-04-11 DIAGNOSIS — M25532 Pain in left wrist: Secondary | ICD-10-CM | POA: Diagnosis present

## 2019-04-11 MED ORDER — MELOXICAM 15 MG PO TABS: 15.0000 mg | ORAL_TABLET | Freq: Every day | ORAL | 0 refills | Status: AC

## 2019-04-11 NOTE — ED Provider Notes (Signed)
[facility] Emergency Department Provider Note  ____________________________________________   First MD Initiated Contact with Patient 04/11/19 1326     (approximate)  I have reviewed the triage vital signs and the nursing notes.   HISTORY  Chief Complaint Hand Pain    HPI Ruben Willis is a 73 y.o. male presents emergency department complaining of left wrist pain.  Patient states he was shaking out a screen when he felt a sharp pain to the left wrist.  He states it hurts to dorsiflex it, shake someone's hand, and the pain will radiate up to the left forearm.  He states his left hand is also swollen.  He denies any numbness or tingling.    Past Medical History:  Diagnosis Date  . A-fib ([place])   . Hypercholesteremia   . Hyperlipidemia   . Hypertension   . Pulmonary embolism (H[facility])     There are no active problems to display for this patient.   Past Surgical History:  Procedure Laterality Date  . APPENDECTOMY    . COSMETIC SURGERY     eye lids  . NASAL SEPTUM SURGERY    . PACEMAKER PLACEMENT  08/2018    Prior to Admission medications   Medication Sig Start Date End Date Taking? Authorizing Provider  aspirin 325 MG tablet Take 325 mg by mouth daily.    [provider]  candesartan (ATACAND) 8 MG tablet Take 16 mg by mouth daily.    [provider]  esomeprazole (NEXIUM) 40 MG capsule Take 40 mg by mouth daily at 12 noon.    [provider]  ezetimibe-simvastatin (VYTORIN) 10-20 MG per tablet Take 1 tablet by mouth daily.    [provider]  HYDROcodone-acetaminophen (NORCO/VICODIN) 5-325 MG tablet Take 1 tablet by mouth every 4 (four) hours as needed for moderate pain. 09/11/16   Johnn Hai, PA-C  levocetirizine (XYZAL) 5 MG tablet Take 5 mg by mouth every evening.    [provider]  meloxicam (MOBIC) 15 MG tablet Take 1 tablet (15 mg total) by mouth daily. 04/11/19 04/10/20  Fisher, Linden Dolin, PA-C  propafenone (RYTHMOL SR) 325 MG 12 hr capsule Take 325 mg by mouth 2 (two) times daily.    [provider]    Allergies Morphine, Nitroglycerin, and Penicillins  No family history on file.  Social History Social History   Tobacco Use  . Smoking status: Never Smoker  . Smokeless tobacco: Never Used  Substance Use Topics  . Alcohol use: No  . Drug use: No    Review of Systems  Constitutional: No fever/chills Eyes: No visual changes. ENT: No sore throat. Respiratory: Denies cough Genitourinary: Negative for dysuria. Musculoskeletal: Negative for back pain.  Positive for left wrist pain Skin: Negative for rash.    ____________________________________________   PHYSICAL EXAM:  VITAL SIGNS: ED Triage Vitals  Enc Vitals Group     BP 04/11/19 1252 131/67     Pulse Rate 04/11/19 1252 72     Resp 04/11/19 1252 16     Temp 04/11/19 1252 98.2 F (36.8 C)     Temp Source 04/11/19 1252 Oral     SpO2 04/11/19 1252 97 %     Weight 04/11/19 1249 203 lb 0.7 oz (92.1 kg)     Height --      Head Circumference --      Peak Flow --      Pain Score 04/11/19 1249 6     Pain  Loc --      Pain Edu? --      Excl. in [place]? --     Constitutional: Alert and oriented. Well appearing and in no acute distress. Eyes: Conjunctivae are normal.  Head: Atraumatic. Nose: No congestion/rhinnorhea. Mouth/Throat: Mucous membranes are moist.   Neck:  supple no lymphadenopathy noted Cardiovascular: Normal rate, regular rhythm. Heart sounds are normal Respiratory: Normal respiratory effort.  No retractions, lungs c t a  Abd: soft nontender bs normal all 4 quad GU: deferred Musculoskeletal: FROM all extremities, warm and well perfused, pain reproduced with handshake, supination, bony tenderness noted at the carpal bones.  Left hand is slightly swollen.  No bony tenderness noted in the metacarpals. Neurologic:  Normal speech and language.  Skin:  Skin is warm, dry and intact. No  rash noted. Psychiatric: Mood and affect are normal. Speech and behavior are normal.  ____________________________________________   LABS (all labs ordered are listed, but only abnormal results are displayed)  Labs Reviewed - No data to display ____________________________________________   ____________________________________________  RADIOLOGY  X-ray of the left wrist is negative  ____________________________________________   PROCEDURES  Procedure(s) performed: Velcro thumb spica splint applied   Procedures    ____________________________________________   INITIAL IMPRESSION / ASSESSMENT AND PLAN / ED COURSE  Pertinent labs & imaging results that were available during my care of the patient were reviewed by me and considered in my medical decision making (see chart for details).   Patient is a 73 year old male presents emergency department left wrist pain.  See HPI  Physical exam shows patient appears well.  Left wrist tender.  Pain is reproduced with movement.  X-ray left wrist is negative.  Plan to the patient that he has tendinitis.  Is placed in thumb spica splint.  Given prescription for meloxicam.  [facility]-up with [facility] if not improving in 5 to 7 days.  States he understands will comply.  Is discharged stable condition.    Ruben Willis was evaluated in Emergency Department on 04/11/2019 for the symptoms described in the history of present illness. He was evaluated in the context of the global COVID-19 pandemic, which necessitated consideration that the patient might be at risk for infection with the SARS-CoV-2 virus that causes COVID-19. Institutional protocols and algorithms that pertain to the evaluation of patients at risk for COVID-19 are in a state of rapid change based on information released by regulatory bodies including the CDC and federal and state organizations. These policies and algorithms were followed during the patient's care in  the ED.   As part of my medical decision making, I reviewed the following data within the [facility] notes reviewed and incorporated, Old chart reviewed, Radiograph reviewed , Notes from prior ED visits and [place] Controlled Substance Database  ____________________________________________   FINAL CLINICAL IMPRESSION(S) / ED DIAGNOSES  Final diagnoses:  Tendonitis      NEW MEDICATIONS STARTED DURING THIS VISIT:  New Prescriptions   MELOXICAM (MOBIC) 15 MG TABLET    Take 1 tablet (15 mg total) by mouth daily.     Note:  This document was prepared using Dragon voice recognition software and may include unintentional dictation errors.    Versie Starks, PA-C 04/11/19 1440    Blake Divine, MD 04/12/19 [phone]

## 2019-04-11 NOTE — ED Triage Notes (Signed)
C/O left wrist and thumb pain.  States was out in the yard yesterday shaking a screen when he felt a sharp pain to left wrist.

## 2019-04-11 NOTE — ED Notes (Signed)
See triage note  Presents with pain to left thumb/wrist area   Denies any fall but states was doing a lot of yard work this weekend

## 2019-04-11 NOTE — Discharge Instructions (Signed)
Wear the splint for 1 week.  Follow-up with orthopedics if not improving.  Apply ice to the left hand.  Return if worsening.

## 2019-05-31 ENCOUNTER — Other Ambulatory Visit: Payer: Self-pay | Admitting: Specialist

## 2019-06-01 ENCOUNTER — Other Ambulatory Visit
Admission: RE | Admit: 2019-06-01 | Discharge: 2019-06-01 | Disposition: A | Discharge status: Home / Self Care | Payer: Medicare Other | Source: Ambulatory Visit | Attending: Orthopedic Surgery | Admitting: Orthopedic Surgery

## 2019-06-01 HISTORY — DX: Personal history of other diseases of the digestive system: Z87.19

## 2019-06-01 HISTORY — DX: Unspecified osteoarthritis, unspecified site: M19.90

## 2019-06-01 HISTORY — DX: Unspecified cataract: H26.9

## 2019-06-01 NOTE — Patient Instructions (Signed)
Your procedure is scheduled on:  Monday, January 18 Report to Day Surgery on the 2nd floor of the [facility]. To find out your arrival time, please call [phone] between 1PM - 3PM on: Friday, January 15  REMEMBER: Instructions that are not followed completely may result in serious medical risk, up to and including death; or upon the discretion of your surgeon and anesthesiologist your surgery may need to be rescheduled.  Do not eat food after midnight the night before surgery.  No gum chewing, lozengers or hard candies.  You may however, drink CLEAR liquids up to 2 hours before you are scheduled to arrive for your surgery. Do not drink anything within 2 hours of the start of your surgery.  Clear liquids include: - water  - apple juice without pulp - gatorade - black coffee or tea (Do NOT add milk or creamers to the coffee or tea) Do NOT drink anything that is not on this list.  No Alcohol for 24 hours before or after surgery.  No Smoking including e-cigarettes for 24 hours prior to surgery.  No chewable tobacco products for at least 6 hours prior to surgery.  No nicotine patches on the day of surgery.  On the morning of surgery brush your teeth with toothpaste and water, you may rinse your mouth with mouthwash if you wish. Do not swallow any toothpaste or mouthwash.  Notify your doctor if there is any change in your medical condition (cold, fever, infection).  Do not wear jewelry, make-up, hairpins, clips or nail polish.  Do not wear lotions, powders, or perfumes.   Do not shave 48 hours prior to surgery.   Contacts and dentures may not be worn into surgery.  Do not bring valuables to the hospital, including drivers license, insurance or credit cards.  [facility] is not responsible for any belongings or valuables.   TAKE THESE MEDICATIONS THE MORNING OF SURGERY:  1.  Candesartan (Atacand) 2.  Esomeprazole (Nexium) - (take one the night before and one on the  morning of surgery - helps to prevent nausea after surgery.) 3.  Propafenone (Rythmol)  Use CHG Soap as directed on instruction sheet.  Follow recommendations from Cardiologist, Pulmonologist or PCP regarding stopping Aspirin. (continue)  Stop Anti-inflammatories (NSAIDS) such as Advil, Aleve, Ibuprofen, Motrin, Naproxen, Naprosyn and Aspirin based products such as Excedrin, Goodys Powder, BC Powder. (May take Tylenol or Acetaminophen if needed.)  Stop ANY OVER THE COUNTER supplements until after surgery.  Wear comfortable clothing (specific to your surgery type) to the hospital.  If you are being discharged the day of surgery, you will not be allowed to drive home. You will need a responsible adult to drive you home and stay with you that night.   If you are taking public transportation, you will need to have a responsible adult with you. Please confirm with your physician that it is acceptable to use public transportation.   Please call [phone] if you have any questions about these instructions.

## 2019-06-01 NOTE — Progress Notes (Signed)
Call to patient to conduct preop interview. Patient states that he had rescheduled this surgery until February. Dr. Ammie Ferrier office notified of this and that he was still on our OR schedule for Monday 1/18/201. Office to confirm and notify scheduling.

## 2019-06-02 ENCOUNTER — Other Ambulatory Visit: Payer: Medicare Other

## 2019-06-05 ENCOUNTER — Ambulatory Visit: Admission: RE | Admit: 2019-06-05 | Payer: Medicare Other | Source: Home / Self Care | Admitting: Specialist

## 2019-06-05 ENCOUNTER — Encounter: Admission: RE | Payer: Self-pay | Source: Home / Self Care

## 2019-06-05 SURGERY — CARPOMETACARPAL (CMC) FUSION OF THUMB
Anesthesia: General | Site: Thumb | Laterality: Left

## 2019-06-29 ENCOUNTER — Other Ambulatory Visit: Admission: RE | Admit: 2019-06-29 | Payer: Medicare Other | Source: Ambulatory Visit

## 2019-07-03 ENCOUNTER — Ambulatory Visit: Admit: 2019-07-03 | Payer: Medicare Other | Admitting: Specialist

## 2019-07-03 SURGERY — CARPOMETACARPAL (CMC) FUSION OF THUMB
Anesthesia: General | Site: Thumb | Laterality: Left

## 2019-08-04 ENCOUNTER — Other Ambulatory Visit: Payer: Self-pay

## 2019-08-04 ENCOUNTER — Emergency Department: Payer: Medicare Other

## 2019-08-04 ENCOUNTER — Ambulatory Visit
Admission: EM | Admit: 2019-08-04 | Discharge: 2019-08-04 | Disposition: A | Discharge status: Home / Self Care | Payer: Medicare Other

## 2019-08-04 ENCOUNTER — Emergency Department
Admission: EM | Admit: 2019-08-04 | Discharge: 2019-08-04 | Disposition: A | Discharge status: Home / Self Care | Payer: Medicare Other | Attending: Emergency Medicine | Admitting: Emergency Medicine

## 2019-08-04 DIAGNOSIS — S0990XA Unspecified injury of head, initial encounter: Secondary | ICD-10-CM | POA: Insufficient documentation

## 2019-08-04 DIAGNOSIS — Z79899 Other long term (current) drug therapy: Secondary | ICD-10-CM | POA: Insufficient documentation

## 2019-08-04 DIAGNOSIS — I10 Essential (primary) hypertension: Secondary | ICD-10-CM | POA: Insufficient documentation

## 2019-08-04 DIAGNOSIS — Y999 Unspecified external cause status: Secondary | ICD-10-CM | POA: Insufficient documentation

## 2019-08-04 DIAGNOSIS — W19XXXA Unspecified fall, initial encounter: Secondary | ICD-10-CM | POA: Diagnosis not present

## 2019-08-04 DIAGNOSIS — H5712 Ocular pain, left eye: Secondary | ICD-10-CM

## 2019-08-04 DIAGNOSIS — W06XXXA Fall from bed, initial encounter: Secondary | ICD-10-CM | POA: Diagnosis not present

## 2019-08-04 DIAGNOSIS — M545 Low back pain, unspecified: Secondary | ICD-10-CM

## 2019-08-04 DIAGNOSIS — Y92003 Bedroom of unspecified non-institutional (private) residence as the place of occurrence of the external cause: Secondary | ICD-10-CM | POA: Insufficient documentation

## 2019-08-04 DIAGNOSIS — Y939 Activity, unspecified: Secondary | ICD-10-CM | POA: Insufficient documentation

## 2019-08-04 DIAGNOSIS — Z7982 Long term (current) use of aspirin: Secondary | ICD-10-CM | POA: Insufficient documentation

## 2019-08-04 MED ORDER — ONDANSETRON 4 MG PO TBDP: 4.0000 mg | ORAL_TABLET | Freq: Three times a day (TID) | ORAL | 0 refills | Status: AC | PRN

## 2019-08-04 MED ORDER — HYDROCODONE-ACETAMINOPHEN 5-325 MG PO TABS: 1.0000 | ORAL_TABLET | Freq: Four times a day (QID) | ORAL | 0 refills | Status: AC | PRN

## 2019-08-04 NOTE — ED Provider Notes (Signed)
Emergency Department Provider Note  ____________________________________________  Time seen: Approximately 6:52 PM  I have reviewed the triage vital signs and the nursing notes.   HISTORY  Chief Complaint Fall   Historian Patient     HPI Ruben Willis is a 74 y.o. male with a history of A. fib, hypertension, PE and hyperlipidemia, presents to the emergency department after patient reportedly fell 2 feet out of his bed.  Patient states that he hit his head.  Patient is unsure of loss of consciousness given nature of fall.  He has noticed swelling and ecchymosis around left eye.  He is reporting upper back pain and occasional shortness of breath when he lays flat in a supine position.  He denies current chest pain or chest tightness.  No numbness or tingling in the upper and lower extremities.  He has been able to ambulate since injury occurred.  Patient has multiple abrasions of face.   Past Medical History:  Diagnosis Date  . A-fib ([place])   . Arthritis   . Cataract, bilateral   . History of hiatal hernia   . Hypercholesteremia   . Hyperlipidemia   . Hypertension   . Pulmonary embolism ([place])      Immunizations up to date:  Yes.     Past Medical History:  Diagnosis Date  . A-fib ([place])   . Arthritis   . Cataract, bilateral   . History of hiatal hernia   . Hypercholesteremia   . Hyperlipidemia   . Hypertension   . Pulmonary embolism ([facility])     There are no problems to display for this patient.   Past Surgical History:  Procedure Laterality Date  . APPENDECTOMY    . CARDIAC CATHETERIZATION  08/13/2011  . CATARACT EXTRACTION, BILATERAL    . COSMETIC SURGERY  05/26/2016   eye lids  . HAND SURGERY    . HERNIA REPAIR     HIATAL  . KNEE SURGERY    . NASAL SEPTUM SURGERY    . OSTEOTOMY AND ULNAR SHORTENING    . PACEMAKER PLACEMENT  08/2018   Dr. Trinna Balloon Bumgarner  . PALATE SURGERY    . PROSTATE SURGERY    . VASECTOMY      Prior to Admission  medications   Medication Sig Start Date End Date Taking? Authorizing Provider  amLODipine (NORVASC) 5 MG tablet amlodipine 5 mg tablet 04/19/19   [provider]  ascorbic acid (VITAMIN C) 1000 MG tablet Take by mouth.    [provider]  ascorbic acid (VITAMIN C) 500 MG tablet Take by mouth.    [provider]  aspirin 325 MG tablet Take 325 mg by mouth daily.    [provider]  aspirin 325 MG tablet Take by mouth.    [provider]  aspirin 81 MG EC tablet Take by mouth. 03/15/15   [provider]  candesartan (ATACAND) 16 MG tablet candesartan 16 mg tablet 09/13/18   [provider]  candesartan (ATACAND) 8 MG tablet Take 16 mg by mouth daily.    [provider]  Cholecalciferol 25 MCG (1000 UT) capsule Take by mouth.    [provider]  esomeprazole (NEXIUM) 40 MG capsule Take 40 mg by mouth daily at 12 noon.    [provider]  ezetimibe-simvastatin (VYTORIN) 10-20 MG per tablet Take 1 tablet by mouth daily.    [provider]  ezetimibe-simvastatin (VYTORIN) 10-20 MG tablet TAKE 1 TABLET NIGHTLY 08/22/18   [provider]  Glucosamine-Chondroit-Vit C-Mn (GLUCOSAMINE 1500 COMPLEX PO) Take by mouth.    [provider]  HYDROcodone-acetaminophen (NORCO) 5-325 MG tablet Take 1 tablet by mouth every 6 (six) hours as needed for up to 3 days. 08/04/19 08/07/19  Lannie Fields, PA-C  levocetirizine (XYZAL) 5 MG tablet Take 5 mg by mouth every evening.    [provider]  levocetirizine (XYZAL) 5 MG tablet TAKE 1 TABLET EVERY EVENING 12/05/18   [provider]  Lutein 6 MG CAPS Take by mouth.    [provider]  LUTEIN PO Take by mouth.    [provider]  meloxicam (MOBIC) 15 MG tablet Take 1 tablet (15 mg total) by mouth daily. 04/11/19 04/10/20  Versie Starks, PA-C  Multiple Vitamin (MULTI-VITAMIN) tablet Take by mouth.    [provider]   Multiple Vitamins tablet Take by mouth.    [provider]  Omega-3 Fatty Acids (FISH OIL) 1000 MG CAPS TAKE 4 CAPSULES NIGHTLY 01/31/17   [provider]  ondansetron (ZOFRAN ODT) 4 MG disintegrating tablet Take 1 tablet (4 mg total) by mouth every 8 (eight) hours as needed for up to 5 days for nausea or vomiting. 08/04/19 08/09/19  Lannie Fields, PA-C  oxybutynin (DITROPAN-XL) 10 MG 24 hr tablet oxybutynin chloride ER 10 mg tablet,extended release 24 hr 06/29/14   [provider]  pantoprazole (PROTONIX) 40 MG tablet TAKE 1 TABLET DAILY 07/27/17   [provider]  pantoprazole (PROTONIX) 40 MG tablet pantoprazole 40 mg tablet,delayed release    [provider]  propafenone (RYTHMOL SR) 325 MG 12 hr capsule Take 325 mg by mouth 2 (two) times daily.    [provider]  propafenone (RYTHMOL SR) 325 MG 12 hr capsule Take by mouth.    [provider]  propafenone (RYTHMOL SR) 425 MG 12 hr capsule propafenone ER 425 mg capsule,extended release 12 hr 09/13/18   [provider]    Allergies Penicillins, Morphine, and Nitroglycerin  No family history on file.  Social History Social History   Tobacco Use  . Smoking status: Never Smoker  . Smokeless tobacco: Never Used  Substance Use Topics  . Alcohol use: No  . Drug use: No     Review of Systems  Constitutional: No fever/chills Eyes:  No discharge ENT: No upper respiratory complaints. Respiratory: no cough. No SOB/ use of accessory muscles to breath Gastrointestinal:   No nausea, no vomiting.  No diarrhea.  No constipation. Musculoskeletal: Negative for musculoskeletal pain. Neuro: Patient has headache.  Skin: Patient has abrasions and periorbital ecchymosis on the left.    ____________________________________________   PHYSICAL EXAM:  VITAL SIGNS: ED Triage Vitals  Enc Vitals Group     BP 08/04/19 1703 (!) 162/83     Pulse Rate 08/04/19 1703 87     Resp  08/04/19 1703 18     Temp 08/04/19 1703 98.1 F (36.7 C)     Temp Source 08/04/19 1703 Oral     SpO2 08/04/19 1703 96 %     Weight 08/04/19 1704 209 lb (94.8 kg)     Height 08/04/19 1704 5\' 9"  (1.753 m)     Head Circumference --      Peak Flow --      Pain Score 08/04/19 1704 4     Pain Loc --      Pain Edu? --      Excl. in [place]? --      Constitutional: Alert and  oriented. Well appearing and in no acute distress. Eyes: Conjunctivae are normal. PERRL. EOMI. Head: Atraumatic.  Patient has left-sided periorbital hematoma. ENT:      Nose: No congestion/rhinnorhea.      Mouth/Throat: Mucous membranes are moist.  Neck: No stridor.  Full range of motion.  No midline C-spine tenderness to palpation. Cardiovascular: Normal rate, regular rhythm. Normal S1 and S2.  Good peripheral circulation. Respiratory: Normal respiratory effort without tachypnea or retractions. Lungs CTAB. Good air entry to the bases with no decreased or absent breath sounds Gastrointestinal: Bowel sounds x 4 quadrants. Soft and nontender to palpation. No guarding or rigidity. No distention. Musculoskeletal: Full range of motion to all extremities. No obvious deformities noted.  Patient has paraspinal muscle tenderness along the thoracic spine. Neurologic:  Normal for age. No gross focal neurologic deficits are appreciated.  Skin:  Skin is warm, dry and intact. No rash noted. Psychiatric: Mood and affect are normal for age. Speech and behavior are normal.   ____________________________________________   LABS (all labs ordered are listed, but only abnormal results are displayed)  Labs Reviewed - No data to display ____________________________________________  EKG   ____________________________________________  RADIOLOGY Unk Pinto, personally viewed and evaluated these images (plain radiographs) as part of my medical decision making, as well as reviewing the written report by the radiologist.  DG Chest 2  View  Result Date: 08/04/2019 CLINICAL DATA:  74 year old male with history of sharp upper back pain after falling out of bed this morning. EXAM: CHEST - 2 VIEW COMPARISON:  Chest x-ray 08/18/2016. FINDINGS: Lung volumes are normal. No consolidative airspace disease. No pleural effusions. No pneumothorax. No pulmonary nodule or mass noted. Pulmonary vasculature and the cardiomediastinal silhouette are within normal limits. Atherosclerosis in the thoracic aorta. Irregularity of the posterolateral aspect of the left eighth and ninth ribs, similar to prior study 08/18/2016, compatible with old healed rib fractures. No definite acute displaced rib fractures identified. Left-sided pacemaker device in place with lead tips projecting over the expected location of the right atrium and right ventricle. IMPRESSION: 1. No radiographic evidence of acute cardiopulmonary disease. 2. Old healed left-sided rib fractures, as above. 3. Aortic atherosclerosis. Electronically Signed   By: Vinnie Langton M.D.   On: 08/04/2019 19:03   DG Thoracic Spine 2 View  Result Date: 08/04/2019 CLINICAL DATA:  Pain status post fall EXAM: THORACIC SPINE 2 VIEWS COMPARISON:  None. FINDINGS: There is some chronic. Mild anterior wedging of several midthoracic vertebral bodies without evidence for an acute displaced fracture. There is no malalignment. Mild degenerative changes are noted. A dual chamber left-sided pacemaker is noted. The cardiac silhouette is likely enlarged. IMPRESSION: Negative. Electronically Signed   By: Constance Holster M.D.   On: 08/04/2019 19:02   CT Head Wo Contrast  Result Date: 08/04/2019 CLINICAL DATA:  74 year old male with history of facial trauma after rolling out of bed and following approximately 2 feet to the floor this morning. Bruising over the left eye. EXAM: CT HEAD WITHOUT CONTRAST CT MAXILLOFACIAL WITHOUT CONTRAST CT CERVICAL SPINE WITHOUT CONTRAST TECHNIQUE: Multidetector CT imaging of the head,  cervical spine, and maxillofacial structures were performed using the standard protocol without intravenous contrast. Multiplanar CT image reconstructions of the cervical spine and maxillofacial structures were also generated. COMPARISON:  No priors. FINDINGS: CT HEAD FINDINGS Brain: Patchy and confluent areas of decreased attenuation are noted throughout the deep and periventricular white matter of the cerebral hemispheres bilaterally, compatible with chronic microvascular ischemic disease. No  evidence of acute infarction, hemorrhage, hydrocephalus, extra-axial collection or mass lesion/mass effect. Vascular: No hyperdense vessel or unexpected calcification. Skull: Normal. Negative for fracture or focal lesion. Other: High attenuation soft tissue swelling in the left frontal scalp extending into the periorbital region. CT MAXILLOFACIAL FINDINGS Osseous: No fracture or mandibular dislocation. No destructive process. Orbits: Negative. No traumatic or inflammatory finding. Sinuses: Status post bilateral maxillary antrectomy. Small mucosal retention cyst or polyp in the inferior aspect of the right maxillary sinus incidentally noted. Otherwise clear. Soft tissues: Extensive high attenuation soft tissue swelling in the frontal scalp extending into the left periorbital region, compatible with a large hematoma. CT CERVICAL SPINE FINDINGS Alignment: Normal. Skull base and vertebrae: No acute fracture. No primary bone lesion or focal pathologic process. Soft tissues and spinal canal: No prevertebral fluid or swelling. No visible canal hematoma. Disc levels: Multilevel degenerative disc disease, most severe at C3-C4, C4-C5 and C6-C7. Moderate multilevel facet arthropathy. Upper chest: Negative. Other: None. IMPRESSION: 1. Large frontal scalp hematoma extending into the left periorbital region. No underlying displaced skull or facial bone fracture or signs of significant acute intracranial trauma. 2. No evidence of  significant acute traumatic injury to the cervical spine. 3. Mild chronic microvascular ischemic changes in the cerebral white matter. 4. Multilevel degenerative disc disease and cervical spondylosis, as above. Electronically Signed   By: Vinnie Langton M.D.   On: 08/04/2019 17:47   CT Cervical Spine Wo Contrast  Result Date: 08/04/2019 CLINICAL DATA:  74 year old male with history of facial trauma after rolling out of bed and following approximately 2 feet to the floor this morning. Bruising over the left eye. EXAM: CT HEAD WITHOUT CONTRAST CT MAXILLOFACIAL WITHOUT CONTRAST CT CERVICAL SPINE WITHOUT CONTRAST TECHNIQUE: Multidetector CT imaging of the head, cervical spine, and maxillofacial structures were performed using the standard protocol without intravenous contrast. Multiplanar CT image reconstructions of the cervical spine and maxillofacial structures were also generated. COMPARISON:  No priors. FINDINGS: CT HEAD FINDINGS Brain: Patchy and confluent areas of decreased attenuation are noted throughout the deep and periventricular white matter of the cerebral hemispheres bilaterally, compatible with chronic microvascular ischemic disease. No evidence of acute infarction, hemorrhage, hydrocephalus, extra-axial collection or mass lesion/mass effect. Vascular: No hyperdense vessel or unexpected calcification. Skull: Normal. Negative for fracture or focal lesion. Other: High attenuation soft tissue swelling in the left frontal scalp extending into the periorbital region. CT MAXILLOFACIAL FINDINGS Osseous: No fracture or mandibular dislocation. No destructive process. Orbits: Negative. No traumatic or inflammatory finding. Sinuses: Status post bilateral maxillary antrectomy. Small mucosal retention cyst or polyp in the inferior aspect of the right maxillary sinus incidentally noted. Otherwise clear. Soft tissues: Extensive high attenuation soft tissue swelling in the frontal scalp extending into the left  periorbital region, compatible with a large hematoma. CT CERVICAL SPINE FINDINGS Alignment: Normal. Skull base and vertebrae: No acute fracture. No primary bone lesion or focal pathologic process. Soft tissues and spinal canal: No prevertebral fluid or swelling. No visible canal hematoma. Disc levels: Multilevel degenerative disc disease, most severe at C3-C4, C4-C5 and C6-C7. Moderate multilevel facet arthropathy. Upper chest: Negative. Other: None. IMPRESSION: 1. Large frontal scalp hematoma extending into the left periorbital region. No underlying displaced skull or facial bone fracture or signs of significant acute intracranial trauma. 2. No evidence of significant acute traumatic injury to the cervical spine. 3. Mild chronic microvascular ischemic changes in the cerebral white matter. 4. Multilevel degenerative disc disease and cervical spondylosis, as above. Electronically Signed  By: Vinnie Langton M.D.   On: 08/04/2019 17:47   CT Maxillofacial Wo Contrast  Result Date: 08/04/2019 CLINICAL DATA:  74 year old male with history of facial trauma after rolling out of bed and following approximately 2 feet to the floor this morning. Bruising over the left eye. EXAM: CT HEAD WITHOUT CONTRAST CT MAXILLOFACIAL WITHOUT CONTRAST CT CERVICAL SPINE WITHOUT CONTRAST TECHNIQUE: Multidetector CT imaging of the head, cervical spine, and maxillofacial structures were performed using the standard protocol without intravenous contrast. Multiplanar CT image reconstructions of the cervical spine and maxillofacial structures were also generated. COMPARISON:  No priors. FINDINGS: CT HEAD FINDINGS Brain: Patchy and confluent areas of decreased attenuation are noted throughout the deep and periventricular white matter of the cerebral hemispheres bilaterally, compatible with chronic microvascular ischemic disease. No evidence of acute infarction, hemorrhage, hydrocephalus, extra-axial collection or mass lesion/mass effect.  Vascular: No hyperdense vessel or unexpected calcification. Skull: Normal. Negative for fracture or focal lesion. Other: High attenuation soft tissue swelling in the left frontal scalp extending into the periorbital region. CT MAXILLOFACIAL FINDINGS Osseous: No fracture or mandibular dislocation. No destructive process. Orbits: Negative. No traumatic or inflammatory finding. Sinuses: Status post bilateral maxillary antrectomy. Small mucosal retention cyst or polyp in the inferior aspect of the right maxillary sinus incidentally noted. Otherwise clear. Soft tissues: Extensive high attenuation soft tissue swelling in the frontal scalp extending into the left periorbital region, compatible with a large hematoma. CT CERVICAL SPINE FINDINGS Alignment: Normal. Skull base and vertebrae: No acute fracture. No primary bone lesion or focal pathologic process. Soft tissues and spinal canal: No prevertebral fluid or swelling. No visible canal hematoma. Disc levels: Multilevel degenerative disc disease, most severe at C3-C4, C4-C5 and C6-C7. Moderate multilevel facet arthropathy. Upper chest: Negative. Other: None. IMPRESSION: 1. Large frontal scalp hematoma extending into the left periorbital region. No underlying displaced skull or facial bone fracture or signs of significant acute intracranial trauma. 2. No evidence of significant acute traumatic injury to the cervical spine. 3. Mild chronic microvascular ischemic changes in the cerebral white matter. 4. Multilevel degenerative disc disease and cervical spondylosis, as above. Electronically Signed   By: Vinnie Langton M.D.   On: 08/04/2019 17:47    ____________________________________________    PROCEDURES  Procedure(s) performed:     Procedures     Medications - No data to display   ____________________________________________   INITIAL IMPRESSION / ASSESSMENT AND PLAN / ED COURSE  Pertinent labs & imaging results that were available during my  care of the patient were reviewed by me and considered in my medical decision making (see chart for details).      Assessment and Plan: Fall:  74 year old male presents to the emergency department after a mechanical fall from his bed complaining of headache, left eye periorbital swelling, upper back pain and shortness of breath with supine position.  Patient was hypertensive at triage but other vital signs are reassuring.  Neuro exam was appropriate for age and without acute deficits.  Differential diagnosis originally included intracranial bleed, skull fracture, C-spine fracture, pneumothorax, arrhythmia...  CT head, cervical spine and face revealed no evidence of intracranial bleed, C-spine fracture or facial fracture.  No pneumothorax on chest x-ray.  X-ray examination of the thoracic spine reveals no bony abnormality.  Patient reported that his tetanus status has been updated within the last 5 years.  He was discharged with short course of Norco for pain.  Return precautions were given to return with new or worsening symptoms.  ____________________________________________  FINAL CLINICAL IMPRESSION(S) / ED DIAGNOSES  Final diagnoses:  Fall, initial encounter      NEW MEDICATIONS STARTED DURING THIS VISIT:  ED Discharge Orders         Ordered    HYDROcodone-acetaminophen (NORCO) 5-325 MG tablet  Every 6 hours PRN     08/04/19 1913    ondansetron (ZOFRAN ODT) 4 MG disintegrating tablet  Every 8 hours PRN     08/04/19 1913              This chart was dictated using voice recognition software/Dragon. Despite best efforts to proofread, errors can occur which can change the meaning. Any change was purely unintentional.     Lannie Fields, PA-C 08/04/19 1917    Arta Silence, MD 08/04/19 1929

## 2019-08-04 NOTE — ED Triage Notes (Signed)
Pt to the er for further eval r/t a fall and injuries. Pt rolled out of bed which is 2 foot off the floor and woke up on the floor early this morning. Pt has bruising to the left eye with swelling and back pain. Pt was seen at Urgent care in [facility] who wanted him to come to ER for CT scan.

## 2019-08-04 NOTE — ED Triage Notes (Signed)
Pt is here with facial bruising to the left side of his face & a stiff back pain from a fall last night at 4:30p, states he doesn't remember how he got on the floor, he just woke up there.

## 2019-08-04 NOTE — Discharge Instructions (Addendum)
Would like for you to go to the ER for further evaluation, you could have bleeding in your brain from the force of your fall.  Would like for you to have a CT scan, to make sure that this is not the case.

## 2019-08-04 NOTE — ED Provider Notes (Signed)
Ruben Willis    CSN: [id number] Arrival date & time: 08/04/19  1629      History   Chief Complaint Chief Complaint  Patient presents with  . fall injury    HPI Ruben Willis is a 74 y.o. male.   Patient reports that he fell out of bed this morning about 4:30.  Reports that he thinks he hit his left eye on his dresser on his way to the floor.  Reports that he thinks he was doing this in his sleep, as he woke up in the floor.  Patient reports back pain that started after the fall, that gets worse whenever he takes deep breaths at times.  Patient has history significant for A. fib, hypertension, pacemaker present, PE, per chart review.  Patient denies headache, fever, body aches, chills, rash, other symptoms.  The history is provided by the patient.    Past Medical History:  Diagnosis Date  . A-fib ([place])   . Arthritis   . Cataract, bilateral   . History of hiatal hernia   . Hypercholesteremia   . Hyperlipidemia   . Hypertension   . Pulmonary embolism (HCC)     There are no problems to display for this patient.   Past Surgical History:  Procedure Laterality Date  . APPENDECTOMY    . CARDIAC CATHETERIZATION  08/13/2011  . CATARACT EXTRACTION, BILATERAL    . COSMETIC SURGERY  05/26/2016   eye lids  . HAND SURGERY    . HERNIA REPAIR     HIATAL  . KNEE SURGERY    . NASAL SEPTUM SURGERY    . OSTEOTOMY AND ULNAR SHORTENING    . PACEMAKER PLACEMENT  08/2018   Dr. Trinna Balloon Bumgarner  . PALATE SURGERY    . PROSTATE SURGERY    . VASECTOMY         Home Medications    Prior to Admission medications   Medication Sig Start Date End Date Taking? Authorizing Provider  amLODipine (NORVASC) 5 MG tablet amlodipine 5 mg tablet 04/19/19  Yes [provider]  aspirin 81 MG EC tablet Take by mouth. 03/15/15  Yes [provider]  candesartan (ATACAND) 16 MG tablet candesartan 16 mg tablet 09/13/18  Yes [provider]    ezetimibe-simvastatin (VYTORIN) 10-20 MG tablet TAKE 1 TABLET NIGHTLY 08/22/18  Yes [provider]  levocetirizine (XYZAL) 5 MG tablet TAKE 1 TABLET EVERY EVENING 12/05/18  Yes [provider]  Omega-3 Fatty Acids (FISH OIL) 1000 MG CAPS TAKE 4 CAPSULES NIGHTLY 01/31/17  Yes [provider]  oxybutynin (DITROPAN-XL) 10 MG 24 hr tablet oxybutynin chloride ER 10 mg tablet,extended release 24 hr 06/29/14  Yes [provider]  pantoprazole (PROTONIX) 40 MG tablet TAKE 1 TABLET DAILY 07/27/17  Yes [provider]  propafenone (RYTHMOL SR) 425 MG 12 hr capsule propafenone ER 425 mg capsule,extended release 12 hr 09/13/18  Yes [provider]  ascorbic acid (VITAMIN C) 1000 MG tablet Take by mouth.    [provider]  ascorbic acid (VITAMIN C) 500 MG tablet Take by mouth.    [provider]  aspirin 325 MG tablet Take 325 mg by mouth daily.    [provider]  aspirin 325 MG tablet Take by mouth.    [provider]  candesartan (ATACAND) 8 MG tablet Take 16 mg by mouth daily.    [provider]  Cholecalciferol 25 MCG (1000 UT) capsule Take by mouth.    [provider]  esomeprazole (NEXIUM) 40 MG capsule Take 40 mg by mouth daily at 12 noon.    [provider]  ezetimibe-simvastatin (VYTORIN) 10-20 MG per tablet Take 1 tablet by mouth daily.    [provider]  Glucosamine-Chondroit-Vit C-Mn (GLUCOSAMINE 1500 COMPLEX PO) Take by mouth.    [provider]  HYDROcodone-acetaminophen (NORCO/VICODIN) 5-325 MG tablet Take 1 tablet by mouth every 4 (four) hours as needed for moderate pain. 09/11/16   Johnn Hai, PA-C  levocetirizine (XYZAL) 5 MG tablet Take 5 mg by mouth every evening.    [provider]  Lutein 6 MG CAPS Take by mouth.    [provider]  LUTEIN PO Take by mouth.    [provider]  meloxicam (MOBIC) 15 MG tablet Take 1 tablet (15  mg total) by mouth daily. 04/11/19 04/10/20  Versie Starks, PA-C  Multiple Vitamin (MULTI-VITAMIN) tablet Take by mouth.    [provider]  Multiple Vitamins tablet Take by mouth.    [provider]  pantoprazole (PROTONIX) 40 MG tablet pantoprazole 40 mg tablet,delayed release    [provider]  propafenone (RYTHMOL SR) 325 MG 12 hr capsule Take 325 mg by mouth 2 (two) times daily.    [provider]  propafenone (RYTHMOL SR) 325 MG 12 hr capsule Take by mouth.    [provider]    Family History History reviewed. No pertinent family history.  Social History Social History   Tobacco Use  . Smoking status: Never Smoker  . Smokeless tobacco: Never Used  Substance Use Topics  . Alcohol use: No  . Drug use: No     Allergies   Penicillins, Morphine, and Nitroglycerin   Review of Systems Review of Systems  Constitutional: Negative for chills, fatigue and fever.  HENT: Positive for facial swelling. Negative for ear pain and sore throat.        Swelling and ecchymosis to tissue surrounding the left eye and left upper eyelid all the way to the eyebrow.  Eyes: Negative for pain and visual disturbance.  Respiratory: Negative for cough and shortness of breath.   Cardiovascular: Negative for chest pain, palpitations and leg swelling.  Gastrointestinal: Negative for abdominal pain and vomiting.  Genitourinary: Negative for dysuria and hematuria.  Musculoskeletal: Positive for back pain. Negative for arthralgias and myalgias.  Skin: Negative for color change and rash.  Neurological: Negative for dizziness, tremors, seizures, syncope, speech difficulty, weakness, light-headedness, numbness and headaches.  Hematological: Bruises/bleeds easily.       Patient takes aspirin 81 mg daily.  Psychiatric/Behavioral: Negative.   All other systems reviewed and are negative.    Physical Exam Triage Vital Signs ED Triage Vitals  Enc Vitals Group      BP 08/04/19 1630 (!) 156/80     Pulse Rate 08/04/19 1630 80     Resp 08/04/19 1630 (!) 21     Temp 08/04/19 1630 98.6 F (37 C)     Temp Source 08/04/19 1630 Oral     SpO2 08/04/19 1630 94 %     Weight 08/04/19 1629 209 lb (94.8 kg)     Height --      Head Circumference --      Peak Flow --      Pain Score --      Pain Loc --      Pain Edu? --      Excl. in [place]? --    No data found.  Updated Vital Signs BP (!) 156/80 (BP Location: Left Arm)   Pulse 80   Temp 98.6 F (37 C) (Oral)   Resp (!) 21   Wt 209 lb (94.8 kg)   SpO2 94%   BMI 30.86 kg/m   Visual Acuity Right Eye Distance:   Left Eye Distance:   Bilateral Distance:    Right Eye Near:   Left Eye Near:    Bilateral Near:     Physical Exam Vitals and nursing note reviewed.  Constitutional:      General: He is not in acute distress.    Appearance: Normal appearance. He is well-developed and normal weight.  HENT:     Head: Normocephalic. Contusion, left periorbital erythema and laceration present.      Comments: Area of ecchymosis and swelling outlined in red diagram above.    Mouth/Throat:     Mouth: Mucous membranes are moist.     Pharynx: Oropharynx is clear.  Eyes:     Extraocular Movements: Extraocular movements intact.     Conjunctiva/sclera: Conjunctivae normal.     Pupils: Pupils are equal, round, and reactive to light.  Cardiovascular:     Rate and Rhythm: Normal rate and regular rhythm.     Heart sounds: Normal heart sounds. No murmur.  Pulmonary:     Effort: Pulmonary effort is normal. No respiratory distress.     Breath sounds: Normal breath sounds. No stridor. No wheezing, rhonchi or rales.  Chest:     Chest wall: No tenderness.  Abdominal:     General: Bowel sounds are normal. There is no distension.     Palpations: Abdomen is soft. There is no mass.     Tenderness: There is no abdominal tenderness. There is no guarding or rebound.     Hernia: No hernia is present.  Musculoskeletal:         General: Signs of injury present.     Cervical back: Normal range of motion and neck supple.  Skin:    General: Skin is warm and dry.     Capillary Refill: Capillary refill takes less than 2 seconds.  Neurological:     General: No focal deficit present.     Mental Status: He is alert and oriented to person, place, and time.     Cranial Nerves: No cranial nerve deficit.     Sensory: No sensory deficit.     Motor: No weakness.     Coordination: Coordination normal.     Gait: Gait normal.     Deep Tendon Reflexes: Reflexes normal.  Psychiatric:        Mood and Affect: Mood normal.        Behavior: Behavior normal.        Thought Content: Thought content normal.      UC Treatments / Results  Labs (all labs ordered are listed, but only abnormal results are displayed) Labs Reviewed - No data to display  EKG   Radiology No results found.  Procedures Procedures (including critical care time)  Medications Ordered in UC Medications - No data to display  Initial Impression / Assessment and Plan / UC Course  I have reviewed the triage vital signs and the nursing notes.  Pertinent labs & imaging results that were available during my care of the patient were reviewed by me and considered in my medical decision making (see chart for details).    Presents with left periorbital erythema, ecchymosis, tenderness, contusion, laceration.  Hit dresser with left eye  during a fall early this morning.  Patient instructed to go to the ER for further evaluation and treatment.  Patient has pacemaker, hypertension, takes 81 mg aspirin daily, has history of PE. Final Clinical Impressions(s) / UC Diagnoses   Final diagnoses:  Fall, initial encounter  Left eye pain  Acute midline low back pain without sciatica     Discharge Instructions     Would like for you to go to the ER for further evaluation, you could have bleeding in your brain from the force of your fall.  Would like for you  to have a CT scan, to make sure that this is not the case.    ED Prescriptions    None     PDMP not reviewed this encounter.   Faustino Congress, NP 08/04/19 1653

## 2019-08-31 ENCOUNTER — Emergency Department: Payer: Medicare Other

## 2019-08-31 ENCOUNTER — Emergency Department
Admission: EM | Admit: 2019-08-31 | Discharge: 2019-08-31 | Disposition: A | Discharge status: Home / Self Care | Payer: Medicare Other | Attending: Emergency Medicine | Admitting: Emergency Medicine

## 2019-08-31 ENCOUNTER — Other Ambulatory Visit: Payer: Self-pay

## 2019-08-31 DIAGNOSIS — Z7982 Long term (current) use of aspirin: Secondary | ICD-10-CM | POA: Insufficient documentation

## 2019-08-31 DIAGNOSIS — Z950 Presence of cardiac pacemaker: Secondary | ICD-10-CM | POA: Insufficient documentation

## 2019-08-31 DIAGNOSIS — Z23 Encounter for immunization: Secondary | ICD-10-CM | POA: Diagnosis not present

## 2019-08-31 DIAGNOSIS — Y9389 Activity, other specified: Secondary | ICD-10-CM | POA: Insufficient documentation

## 2019-08-31 DIAGNOSIS — S61431A Puncture wound without foreign body of right hand, initial encounter: Secondary | ICD-10-CM | POA: Insufficient documentation

## 2019-08-31 DIAGNOSIS — Y929 Unspecified place or not applicable: Secondary | ICD-10-CM | POA: Diagnosis not present

## 2019-08-31 DIAGNOSIS — Y999 Unspecified external cause status: Secondary | ICD-10-CM | POA: Diagnosis not present

## 2019-08-31 DIAGNOSIS — I10 Essential (primary) hypertension: Secondary | ICD-10-CM | POA: Insufficient documentation

## 2019-08-31 DIAGNOSIS — W5501XA Bitten by cat, initial encounter: Secondary | ICD-10-CM | POA: Insufficient documentation

## 2019-08-31 DIAGNOSIS — Z79899 Other long term (current) drug therapy: Secondary | ICD-10-CM | POA: Diagnosis not present

## 2019-08-31 MED ORDER — TETANUS-DIPHTH-ACELL PERTUSSIS 5-2.5-18.5 LF-MCG/0.5 IM SUSP
0.5000 mL | Freq: Once | INTRAMUSCULAR | Status: AC
  Administered 2019-08-31: 15:00:00 0.5 mL via INTRAMUSCULAR
  Filled 2019-08-31: qty 0.5

## 2019-08-31 MED ORDER — METRONIDAZOLE 500 MG PO TABS
500.0000 mg | ORAL_TABLET | Freq: Once | ORAL | Status: AC
  Administered 2019-08-31: 500 mg via ORAL
  Filled 2019-08-31: qty 1

## 2019-08-31 MED ORDER — SULFAMETHOXAZOLE-TRIMETHOPRIM 800-160 MG PO TABS: 1.0000 | ORAL_TABLET | Freq: Two times a day (BID) | ORAL | 0 refills | Status: DC

## 2019-08-31 MED ORDER — SULFAMETHOXAZOLE-TRIMETHOPRIM 800-160 MG PO TABS
1.0000 | ORAL_TABLET | Freq: Once | ORAL | Status: AC
  Administered 2019-08-31: 1 via ORAL
  Filled 2019-08-31: qty 1

## 2019-08-31 MED ORDER — METRONIDAZOLE 500 MG PO TABS: 500.0000 mg | ORAL_TABLET | Freq: Three times a day (TID) | ORAL | 0 refills | Status: AC

## 2019-08-31 NOTE — ED Triage Notes (Signed)
Pt here with a cat bit to the right hand from today. States he was getting the cat ready to go to the vet and it bit him, up to date on vaccinations.

## 2019-08-31 NOTE — ED Provider Notes (Signed)
[facility] Emergency Department Provider Note  ____________________________________________  Time seen: Approximately 2:29 PM  I have reviewed the triage vital signs and the nursing notes.   HISTORY  Chief Complaint Animal Bite    HPI Ruben Willis is a 74 y.o. male that presents to the emergency department for evaluation of cat bite and scratch today.  Patient was taking his cat to the vet when he was bit.  Vaccinations are up-to-date.  Cat received his rabies shots last June. Patient is allergic to Penicillin. Patient is unsure of last tetanus.  Past Medical History:  Diagnosis Date  . A-fib ([place])   . Arthritis   . Cataract, bilateral   . History of hiatal hernia   . Hypercholesteremia   . Hyperlipidemia   . Hypertension   . Pulmonary embolism (HCC)     There are no problems to display for this patient.   Past Surgical History:  Procedure Laterality Date  . APPENDECTOMY    . CARDIAC CATHETERIZATION  08/13/2011  . CATARACT EXTRACTION, BILATERAL    . COSMETIC SURGERY  05/26/2016   eye lids  . HAND SURGERY    . HERNIA REPAIR     HIATAL  . KNEE SURGERY    . NASAL SEPTUM SURGERY    . OSTEOTOMY AND ULNAR SHORTENING    . PACEMAKER PLACEMENT  08/2018   Dr. Trinna Balloon Bumgarner  . PALATE SURGERY    . PROSTATE SURGERY    . VASECTOMY      Prior to Admission medications   Medication Sig Start Date End Date Taking? Authorizing Provider  amLODipine (NORVASC) 5 MG tablet amlodipine 5 mg tablet 04/19/19   [provider]  ascorbic acid (VITAMIN C) 1000 MG tablet Take by mouth.    [provider]  ascorbic acid (VITAMIN C) 500 MG tablet Take by mouth.    [provider]  aspirin 325 MG tablet Take 325 mg by mouth daily.    [provider]  aspirin 325 MG tablet Take by mouth.    [provider]  aspirin 81 MG EC tablet Take by mouth. 03/15/15   [provider]  candesartan (ATACAND) 16 MG  tablet candesartan 16 mg tablet 09/13/18   [provider]  candesartan (ATACAND) 8 MG tablet Take 16 mg by mouth daily.    [provider]  Cholecalciferol 25 MCG (1000 UT) capsule Take by mouth.    [provider]  esomeprazole (NEXIUM) 40 MG capsule Take 40 mg by mouth daily at 12 noon.    [provider]  ezetimibe-simvastatin (VYTORIN) 10-20 MG per tablet Take 1 tablet by mouth daily.    [provider]  ezetimibe-simvastatin (VYTORIN) 10-20 MG tablet TAKE 1 TABLET NIGHTLY 08/22/18   [provider]  Glucosamine-Chondroit-Vit C-Mn (GLUCOSAMINE 1500 COMPLEX PO) Take by mouth.    [provider]  levocetirizine (XYZAL) 5 MG tablet Take 5 mg by mouth every evening.    [provider]  levocetirizine (XYZAL) 5 MG tablet TAKE 1 TABLET EVERY EVENING 12/05/18   [provider]  Lutein 6 MG CAPS Take by mouth.    [provider]  LUTEIN PO Take by mouth.    [provider]  meloxicam (MOBIC) 15 MG tablet Take 1 tablet (15 mg total) by mouth daily. 04/11/19 04/10/20  Fisher, Linden Dolin, PA-C  metroNIDAZOLE (FLAGYL) 500 MG tablet Take 1 tablet (500 mg total) by mouth 3 (three) times daily for 7 days. 08/31/19  09/07/19  Laban Emperor, PA-C  Multiple Vitamin (MULTI-VITAMIN) tablet Take by mouth.    [provider]  Multiple Vitamins tablet Take by mouth.    [provider]  Omega-3 Fatty Acids (FISH OIL) 1000 MG CAPS TAKE 4 CAPSULES NIGHTLY 01/31/17   [provider]  oxybutynin (DITROPAN-XL) 10 MG 24 hr tablet oxybutynin chloride ER 10 mg tablet,extended release 24 hr 06/29/14   [provider]  pantoprazole (PROTONIX) 40 MG tablet TAKE 1 TABLET DAILY 07/27/17   [provider]  pantoprazole (PROTONIX) 40 MG tablet pantoprazole 40 mg tablet,delayed release    [provider]  propafenone (RYTHMOL SR) 325 MG 12 hr capsule Take 325 mg by mouth 2 (two) times daily.     [provider]  propafenone (RYTHMOL SR) 325 MG 12 hr capsule Take by mouth.    [provider]  propafenone (RYTHMOL SR) 425 MG 12 hr capsule propafenone ER 425 mg capsule,extended release 12 hr 09/13/18   [provider]  sulfamethoxazole-trimethoprim (BACTRIM DS) 800-160 MG tablet Take 1 tablet by mouth 2 (two) times daily. 08/31/19   Laban Emperor, PA-C    Allergies Penicillins, Morphine, and Nitroglycerin  No family history on file.  Social History Social History   Tobacco Use  . Smoking status: Never Smoker  . Smokeless tobacco: Never Used  Substance Use Topics  . Alcohol use: No  . Drug use: No     Review of Systems  Respiratory: No SOB. Gastrointestinal: No abdominal pain.  No nausea, no vomiting.  Musculoskeletal: Positive for hand pain. Skin: Negative for rash, abrasions, lacerations, ecchymosis. Positive for punctures. Neurological: Negative for headaches   ____________________________________________   PHYSICAL EXAM:  VITAL SIGNS: ED Triage Vitals  Enc Vitals Group     BP 08/31/19 1318 (!) 134/94     Pulse Rate 08/31/19 1318 84     Resp 08/31/19 1318 16     Temp 08/31/19 1318 98.4 F (36.9 C)     Temp Source 08/31/19 1318 Oral     SpO2 08/31/19 1318 98 %     Weight 08/31/19 1311 209 lb (94.8 kg)     Height 08/31/19 1311 5\' 9"  (1.753 m)     Head Circumference --      Peak Flow --      Pain Score 08/31/19 1311 0     Pain Loc --      Pain Edu? --      Excl. in [place]? --      Constitutional: Alert and oriented. Well appearing and in no acute distress. Eyes: Conjunctivae are normal. PERRL. EOMI. Head: Atraumatic. ENT:      Ears:      Nose: No congestion/rhinnorhea.      Mouth/Throat: Mucous membranes are moist.  Neck: No stridor.  Cardiovascular: Normal rate, regular rhythm.  Good peripheral circulation. Respiratory: Normal respiratory effort without tachypnea or retractions. Lungs CTAB. Good air entry to the bases with  no decreased or absent breath sounds. Musculoskeletal: Full range of motion to all extremities. No gross deformities appreciated. Neurologic:  Normal speech and language. No gross focal neurologic deficits are appreciated.  Skin:  Skin is warm, dry.  2 punctures to volar right hand at the base of the thumb.  Abrasions to dorsal right hand at the base of the thumb and volar wrist. Psychiatric: Mood and affect are normal. Speech and behavior are normal. Patient exhibits appropriate insight and judgement.   ____________________________________________   LABS (all labs ordered  are listed, but only abnormal results are displayed)  Labs Reviewed - No data to display ____________________________________________  EKG   ____________________________________________  RADIOLOGY Robinette Haines, personally viewed and evaluated these images (plain radiographs) as part of my medical decision making, as well as reviewing the written report by the radiologist.  DG Hand Complete Right  Result Date: 08/31/2019 CLINICAL DATA:  74 year old presenting with a cat bite to the RIGHT hand that occurred earlier today. Initial encounter. EXAM: RIGHT HAND - COMPLETE 3+ VIEW COMPARISON:  RIGHT index finger x-rays 07/04/2017. No prior RIGHT hand imaging. FINDINGS: No evidence of acute fracture or dislocation. Narrowing of the IP joints of the fingers and thumb. Severe narrowing of the trapezium-first metacarpal joint space with intra-articular loose bodies. Well-preserved bone mineral density. Prior ORIF of a distal ulnar fracture which is incompletely imaged. IMPRESSION: 1. No acute osseous abnormality. 2. 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 Electronically Signed   By: Evangeline Dakin M.D.   On: 08/31/2019 14:03    ____________________________________________    PROCEDURES  Procedure(s) performed:     Procedures    Medications  metroNIDAZOLE (FLAGYL) tablet 500 mg (500 mg Oral Given 08/31/19 1438)  sulfamethoxazole-trimethoprim (BACTRIM DS) 800-160 MG per tablet 1 tablet (1 tablet Oral Given 08/31/19 1438)  Tdap (BOOSTRIX) injection 0.5 mL (0.5 mLs Intramuscular Given 08/31/19 1439)     ____________________________________________   INITIAL IMPRESSION / ASSESSMENT AND PLAN / ED COURSE  Pertinent labs & imaging results that were available during my care of the patient were reviewed by me and considered in my medical decision making (see chart for details).  Review of the [place] CSRS was performed in accordance of the [place] prior to dispensing any controlled drugs.   Patient presented to emergency department for evaluation of cat bite.  Vital signs and exam are reassuring.  Tetanus shot was updated.  Cats rabies vaccines are up-to-date.  Patient is allergic to penicillin so he will not be started on Augmentin.  Patient will be discharged home with prescriptions for Bactrim and Flagyl. Patient is to follow up with primary care as directed. Patient is given ED precautions to return to the ED for any worsening or new symptoms.   Delvon Ambroise was evaluated in Emergency Department on 08/31/2019 for the symptoms described in the history of present illness. He was evaluated in the context of the global COVID-19 pandemic, which necessitated consideration that the patient might be at risk for infection with the SARS-CoV-2 virus that causes COVID-19. Institutional protocols and algorithms that pertain to the evaluation of patients at risk for COVID-19 are in a state of rapid change based on information released by regulatory bodies including the CDC and federal and state organizations. These policies and algorithms were followed during the patient's care in the ED.  ____________________________________________  FINAL CLINICAL IMPRESSION(S) / ED DIAGNOSES  Final diagnoses:  Cat bite, initial  encounter      NEW MEDICATIONS STARTED DURING THIS VISIT:  ED Discharge Orders         Ordered    metroNIDAZOLE (FLAGYL) 500 MG tablet  3 times daily     08/31/19 1441    sulfamethoxazole-trimethoprim (BACTRIM DS) 800-160 MG tablet  2 times daily     08/31/19 1441              This chart was dictated using voice recognition software/Dragon. Despite best efforts to proofread, errors can occur which can change the meaning. Any  change was purely unintentional.    Laban Emperor, PA-C 08/31/19 1555    Blake Divine, MD 09/01/19 1230

## 2019-11-01 ENCOUNTER — Encounter: Payer: Self-pay | Admitting: Emergency Medicine

## 2019-11-01 ENCOUNTER — Other Ambulatory Visit: Payer: Self-pay

## 2019-11-01 ENCOUNTER — Ambulatory Visit
Admission: EM | Admit: 2019-11-01 | Discharge: 2019-11-01 | Disposition: A | Discharge status: Home / Self Care | Payer: Medicare Other | Attending: Internal Medicine | Admitting: Internal Medicine

## 2019-11-01 DIAGNOSIS — S46811A Strain of other muscles, fascia and tendons at shoulder and upper arm level, right arm, initial encounter: Secondary | ICD-10-CM

## 2019-11-01 DIAGNOSIS — M62838 Other muscle spasm: Secondary | ICD-10-CM

## 2019-11-01 MED ORDER — BACLOFEN 10 MG PO TABS: ORAL_TABLET | ORAL | 0 refills | Status: DC

## 2019-11-01 NOTE — ED Provider Notes (Signed)
[facility]    CSN: [id number] Arrival date & time: 11/01/19  1506      History   Chief Complaint Chief Complaint  Patient presents with  . Shoulder Pain    HPI Ruben Willis is a 74 y.o. male. who presents with R posterior shoulder pain radiating to his R posterir neck. Has had mild  pain on this area for months but nothing this bad. Denies an injury, but does have a big dog who jerks him forward sometimes. Denies numbness, SOB or cough. His trapezius and R neck pain is provoked with head rotation to his R, anterior flexion, and reaching forward with his R arm     Past Medical History:  Diagnosis Date  . A-fib ([place])   . Arthritis   . Cataract, bilateral   . History of hiatal hernia   . Hypercholesteremia   . Hyperlipidemia   . Hypertension   . Pulmonary embolism (HCC)     There are no problems to display for this patient.   Past Surgical History:  Procedure Laterality Date  . APPENDECTOMY    . CARDIAC CATHETERIZATION  08/13/2011  . CATARACT EXTRACTION, BILATERAL    . COSMETIC SURGERY  05/26/2016   eye lids  . HAND SURGERY    . HERNIA REPAIR     HIATAL  . KNEE SURGERY    . NASAL SEPTUM SURGERY    . OSTEOTOMY AND ULNAR SHORTENING    . PACEMAKER PLACEMENT  08/2018   Dr. Trinna Balloon Bumgarner  . PALATE SURGERY    . PROSTATE SURGERY    . VASECTOMY         Home Medications    Prior to Admission medications   Medication Sig Start Date End Date Taking? Authorizing Provider  amLODipine (NORVASC) 5 MG tablet amlodipine 5 mg tablet 04/19/19  Yes [provider]  aspirin 81 MG EC tablet Take by mouth. 03/15/15  Yes [provider]  candesartan (ATACAND) 16 MG tablet candesartan 16 mg tablet 09/13/18  Yes [provider]  Cholecalciferol 25 MCG (1000 UT) capsule Take by mouth.   Yes [provider]  ezetimibe-simvastatin (VYTORIN) 10-20 MG tablet TAKE 1 TABLET NIGHTLY 08/22/18  Yes [provider]    Glucosamine-Chondroit-Vit C-Mn (GLUCOSAMINE 1500 COMPLEX PO) Take by mouth.   Yes [provider]  levocetirizine (XYZAL) 5 MG tablet Take 5 mg by mouth every evening.   Yes [provider]  Lutein 6 MG CAPS Take by mouth.   Yes [provider]  Multiple Vitamin (MULTI-VITAMIN) tablet Take by mouth.   Yes [provider]  propafenone (RYTHMOL SR) 425 MG 12 hr capsule propafenone ER 425 mg capsule,extended release 12 hr 09/13/18  Yes [provider]  ascorbic acid (VITAMIN C) 1000 MG tablet Take by mouth.    [provider]  ascorbic acid (VITAMIN C) 500 MG tablet Take by mouth.    [provider]  aspirin 325 MG tablet Take 325 mg by mouth daily.    [provider]  aspirin 325 MG tablet Take by mouth.    [provider]  candesartan (ATACAND) 8 MG tablet Take 16 mg by mouth daily.    [provider]  esomeprazole (NEXIUM) 40 MG capsule Take 40 mg by mouth daily at 12 noon.    [provider]  ezetimibe-simvastatin (VYTORIN) 10-20 MG per tablet Take 1 tablet by mouth daily.    [provider]  levocetirizine (XYZAL) 5 MG tablet  TAKE 1 TABLET EVERY EVENING 12/05/18   [provider]  LUTEIN PO Take by mouth.    [provider]  meloxicam (MOBIC) 15 MG tablet Take 1 tablet (15 mg total) by mouth daily. 04/11/19 04/10/20  Versie Starks, PA-C  Multiple Vitamins tablet Take by mouth.    [provider]  Omega-3 Fatty Acids (FISH OIL) 1000 MG CAPS TAKE 4 CAPSULES NIGHTLY 01/31/17   [provider]  oxybutynin (DITROPAN-XL) 10 MG 24 hr tablet oxybutynin chloride ER 10 mg tablet,extended release 24 hr 06/29/14   [provider]  pantoprazole (PROTONIX) 40 MG tablet TAKE 1 TABLET DAILY 07/27/17   [provider]  pantoprazole (PROTONIX) 40 MG tablet pantoprazole 40 mg tablet,delayed release    [provider]  propafenone (RYTHMOL SR) 325  MG 12 hr capsule Take 325 mg by mouth 2 (two) times daily.    [provider]  propafenone (RYTHMOL SR) 325 MG 12 hr capsule Take by mouth.    [provider]  sulfamethoxazole-trimethoprim (BACTRIM DS) 800-160 MG tablet Take 1 tablet by mouth 2 (two) times daily. 08/31/19   Laban Emperor, PA-C    Family History Family History  Problem Relation Age of Onset  . Diabetes Mother   . Heart disease Mother   . Aneurysm Father        brain  . CAD Father     Social History Social History   Tobacco Use  . Smoking status: Never Smoker  . Smokeless tobacco: Never Used  Vaping Use  . Vaping Use: Never used  Substance Use Topics  . Alcohol use: No  . Drug use: No     Allergies   Penicillins, Morphine, and Nitroglycerin   Review of Systems Review of Systems  Constitutional: Negative for appetite change, chills, fatigue and fever.  HENT: Negative for congestion, ear discharge, ear pain, facial swelling, postnasal drip, sinus pressure and sinus pain.   Eyes: Negative for discharge.  Respiratory: Negative for cough and shortness of breath.   Cardiovascular: Negative for chest pain.  Musculoskeletal: Negative for gait problem and myalgias.  Skin: Negative for rash and wound.  Neurological: Negative for dizziness, numbness and headaches.  Hematological: Negative for adenopathy.   Physical Exam Triage Vital Signs ED Triage Vitals [11/01/19 1516]  Enc Vitals Group     BP (!) 118/59     Pulse Rate 85     Resp 18     Temp 98 F (36.7 C)     Temp Source Oral     SpO2 96 %     Weight 210 lb (95.3 kg)     Height 5\' 9"  (1.753 m)     Head Circumference      Peak Flow      Pain Score 8     Pain Loc      Pain Edu?      Excl. in [place]?    No data found.  Updated Vital Signs BP (!) 118/59 (BP Location: Left Arm)   Pulse 85   Temp 98 F (36.7 C) (Oral)   Resp 18   Ht 5\' 9"  (1.753 m)   Wt 210 lb (95.3 kg)   SpO2 96%   BMI 31.01 kg/m   Visual Acuity Right  Eye Distance:   Left Eye Distance:   Bilateral Distance:    Right Eye Near:   Left Eye Near:    Bilateral Near:     Physical Exam Vitals and nursing note  reviewed.  Constitutional:      General: He is not in acute distress.    Appearance: He is normal weight.  HENT:     Head: Atraumatic.     Right Ear: External ear normal.     Left Ear: External ear normal.  Eyes:     General: No scleral icterus.    Extraocular Movements: Extraocular movements intact.     Conjunctiva/sclera: Conjunctivae normal.  Neck:     Comments: Rotation to his L and anterior flexion provoked pain on his R top trapezius. Has tense and tender R upper trapezius and generalized muscle soreness on Rhomboid and lateral thoracic muscle region behind shoulder. His R bicep tendon also seen tender.  Cardiovascular:     Rate and Rhythm: Normal rate and regular rhythm.  Pulmonary:     Effort: Pulmonary effort is normal.     Breath sounds: Normal breath sounds.  Musculoskeletal:        General: No signs of injury. Normal range of motion.     Cervical back: Tenderness present. No rigidity.     Comments: R SHOULDER- normal inspection, has decreased abduction and forward extension due to pain on his trapezius. Also has mild tenderness on his posterior shoulder.   Lymphadenopathy:     Cervical: No cervical adenopathy.  Skin:    General: Skin is warm and dry.     Findings: No bruising, erythema or rash.  Neurological:     Mental Status: He is alert and oriented to person, place, and time.     Motor: No weakness.     Gait: Gait normal.     Deep Tendon Reflexes: Reflexes normal.  Psychiatric:        Mood and Affect: Mood normal.        Behavior: Behavior normal.        Thought Content: Thought content normal.        Judgment: Judgment normal.      UC Treatments / Results  Labs (all labs ordered are listed, but only abnormal results are displayed) Labs Reviewed - No data to display  EKG   Radiology No  results found.  Procedures Procedures (including critical care time)  Medications Ordered in UC Medications - No data to display  Initial Impression / Assessment and Plan / UC Course  I have reviewed the triage vital signs and the nursing notes. I placed him on Baclofen for muscle spasms, educated him on how to use heat and do stretches and needs to FU with ortho and may need PT to help him fully recover.  Final Clinical Impressions(s) / UC Diagnoses   Final diagnoses:  None   Discharge Instructions   None    ED Prescriptions    None     PDMP not reviewed this encounter.   Shelby Mattocks, Hershal Coria 11/02/19 2113

## 2019-11-01 NOTE — ED Triage Notes (Signed)
Patient in today c/o right shoulder and neck pain x 1.5 months, getting worse. No injury noted. Patient has taken OTC Tylenol.

## 2019-11-01 NOTE — Discharge Instructions (Addendum)
Apply heat on area of muscle pain for 20 minutes 3-4 times a day and do the stretches I showed you Follow up with [facility] which is an orthopedic group next week and may be able to set you up with physical therapy if they have their own.   You may take Tylenol 500 mg 1-2 every 8 hour for pain.

## 2019-11-03 ENCOUNTER — Other Ambulatory Visit: Payer: Self-pay | Admitting: Internal Medicine

## 2019-11-21 DIAGNOSIS — Z8679 Personal history of other diseases of the circulatory system: Secondary | ICD-10-CM | POA: Insufficient documentation

## 2019-11-21 DIAGNOSIS — I10 Essential (primary) hypertension: Secondary | ICD-10-CM | POA: Insufficient documentation

## 2019-11-21 DIAGNOSIS — E785 Hyperlipidemia, unspecified: Secondary | ICD-10-CM | POA: Insufficient documentation

## 2019-11-21 DIAGNOSIS — M5030 Other cervical disc degeneration, unspecified cervical region: Secondary | ICD-10-CM | POA: Insufficient documentation

## 2019-11-21 DIAGNOSIS — I480 Paroxysmal atrial fibrillation: Secondary | ICD-10-CM | POA: Insufficient documentation

## 2020-04-16 DIAGNOSIS — Z85828 Personal history of other malignant neoplasm of skin: Secondary | ICD-10-CM | POA: Insufficient documentation

## 2020-08-05 DIAGNOSIS — Z8679 Personal history of other diseases of the circulatory system: Secondary | ICD-10-CM | POA: Insufficient documentation

## 2020-08-24 ENCOUNTER — Ambulatory Visit
Admission: EM | Admit: 2020-08-24 | Discharge: 2020-08-24 | Disposition: A | Discharge status: Home / Self Care | Payer: Medicare Other | Attending: Family Medicine | Admitting: Family Medicine

## 2020-08-24 ENCOUNTER — Other Ambulatory Visit: Payer: Self-pay

## 2020-08-24 DIAGNOSIS — R0602 Shortness of breath: Secondary | ICD-10-CM

## 2020-08-24 DIAGNOSIS — I4891 Unspecified atrial fibrillation: Secondary | ICD-10-CM

## 2020-08-24 NOTE — ED Provider Notes (Signed)
[facility]    CSN: [id number] Arrival date & time: 08/24/20  1156      History   Chief Complaint Chief Complaint  Patient presents with  . Chest Pain  . Dizziness  . Shortness of Breath   HPI  75 year old male with a history of atrial fibrillation, foreign embolism, and is status post pacemaker placement presents with the above complaints.  Patient is had ongoing symptoms for the past few weeks.  He recently saw his PCP and had lab work done and also had his pacemaker interrogated.  He was told that there were no issues.  He states that he continues to feel poorly.  He does not feel like his normal self.  He feels like his heart is racing.  He reports shortness of breath with exertion.  He also reports chest tightness and dizziness.  He states that he has had a similar occurrence previously when he had multiple PEs.  He is concerned.  He rates his pain a 6/10 in severity.  No relieving factors.  No other reported symptoms.  No other complaints.  Past Medical History:  Diagnosis Date  . A-fib ([place])   . Arthritis   . Cataract, bilateral   . History of hiatal hernia   . Hypercholesteremia   . Hyperlipidemia   . Hypertension   . Pulmonary embolism (HCC)     There are no problems to display for this patient.   Past Surgical History:  Procedure Laterality Date  . APPENDECTOMY    . CARDIAC CATHETERIZATION  08/13/2011  . CATARACT EXTRACTION, BILATERAL    . COSMETIC SURGERY  05/26/2016   eye lids  . HAND SURGERY    . HERNIA REPAIR     HIATAL  . KNEE SURGERY    . NASAL SEPTUM SURGERY    . OSTEOTOMY AND ULNAR SHORTENING    . PACEMAKER PLACEMENT  08/2018   Dr. Trinna Balloon Bumgarner  . PALATE SURGERY    . PROSTATE SURGERY    . SKIN CANCER EXCISION    . SKIN GRAFT    . VASECTOMY         Home Medications    Prior to Admission medications   Medication Sig Start Date End Date Taking? Authorizing Provider  amLODipine (NORVASC) 5 MG tablet amlodipine 5 mg  tablet 04/19/19  Yes [provider]  aspirin 81 MG EC tablet Take by mouth. 03/15/15  Yes [provider]  candesartan (ATACAND) 16 MG tablet candesartan 16 mg tablet 09/13/18  Yes [provider]  Cholecalciferol 25 MCG (1000 UT) capsule Take by mouth.   Yes [provider]  ezetimibe-simvastatin (VYTORIN) 10-20 MG per tablet Take 1 tablet by mouth daily.   Yes [provider]  Glucosamine-Chondroit-Vit C-Mn (GLUCOSAMINE 1500 COMPLEX PO) Take by mouth.   Yes [provider]  LUTEIN PO Take by mouth.   Yes [provider]  Multiple Vitamin (MULTI-VITAMIN) tablet Take by mouth.   Yes [provider]  propafenone (RYTHMOL SR) 425 MG 12 hr capsule propafenone ER 425 mg capsule,extended release 12 hr 09/13/18  Yes [provider]  ascorbic acid (VITAMIN C) 1000 MG tablet Take by mouth.    [provider]  ascorbic acid (VITAMIN C) 500 MG tablet Take by mouth.    [provider]  aspirin 325 MG tablet Take 325 mg by mouth daily.    [provider]  aspirin 325 MG tablet Take by mouth.    [provider]  baclofen (LIORESAL) 10 MG tablet 1-2 tid prn muscle spasm 11/01/19   Rodriguez-Southworth, Sunday Spillers, PA-C  candesartan (ATACAND) 8 MG tablet Take 16 mg by mouth daily.    [provider]  esomeprazole (NEXIUM) 40 MG capsule Take 40 mg by mouth daily at 12 noon.    [provider]  ezetimibe-simvastatin (VYTORIN) 10-20 MG tablet TAKE 1 TABLET NIGHTLY 08/22/18   [provider]  levocetirizine (XYZAL) 5 MG tablet Take 5 mg by mouth every evening.    [provider]  levocetirizine (XYZAL) 5 MG tablet TAKE 1 TABLET EVERY EVENING 12/05/18   [provider]  Lutein 6 MG CAPS Take by mouth.    [provider]  Multiple Vitamins tablet Take by mouth.    [provider]  Omega-3 Fatty Acids (FISH OIL) 1000 MG CAPS TAKE 4 CAPSULES NIGHTLY  01/31/17   [provider]  oxybutynin (DITROPAN-XL) 10 MG 24 hr tablet oxybutynin chloride ER 10 mg tablet,extended release 24 hr 06/29/14   [provider]  pantoprazole (PROTONIX) 40 MG tablet TAKE 1 TABLET DAILY 07/27/17   [provider]  pantoprazole (PROTONIX) 40 MG tablet pantoprazole 40 mg tablet,delayed release    [provider]  propafenone (RYTHMOL SR) 325 MG 12 hr capsule Take 325 mg by mouth 2 (two) times daily.    [provider]  propafenone (RYTHMOL SR) 325 MG 12 hr capsule Take by mouth.    [provider]  sulfamethoxazole-trimethoprim (BACTRIM DS) 800-160 MG tablet Take 1 tablet by mouth 2 (two) times daily. 08/31/19   Laban Emperor, PA-C    Family History Family History  Problem Relation Age of Onset  . Diabetes Mother   . Heart disease Mother   . Aneurysm Father        brain  . CAD Father     Social History Social History   Tobacco Use  . Smoking status: Never Smoker  . Smokeless tobacco: Never Used  Vaping Use  . Vaping Use: Never used  Substance Use Topics  . Alcohol use: No  . Drug use: No     Allergies   Penicillins, Morphine and related, Morphine, and Nitroglycerin   Review of Systems Review of Systems Per HPI  Physical Exam Triage Vital Signs ED Triage Vitals  Enc Vitals Group     BP 08/24/20 1212 (!) 161/84     Pulse Rate 08/24/20 1212 81     Resp 08/24/20 1212 18     Temp 08/24/20 1212 98.2 F (36.8 C)     Temp Source 08/24/20 1212 Oral     SpO2 08/24/20 1212 99 %     Weight 08/24/20 1207 210 lb (95.3 kg)     Height 08/24/20 1207 5\' 9"  (1.753 m)     Head Circumference --      Peak Flow --      Pain Score 08/24/20 1207 6     Pain Loc --      Pain Edu? --      Excl. in [place]? --    Updated Vital Signs BP (!) 161/84 (BP Location: Left Arm)   Pulse 81   Temp 98.2 F (36.8 C) (Oral)   Resp 18   Ht 5\' 9"  (1.753 m)   Wt 95.3 kg   SpO2 99%   BMI 31.01 kg/m   Visual  Acuity Right Eye Distance:   Left Eye Distance:   Bilateral Distance:    Right Eye Near:  Left Eye Near:    Bilateral Near:     Physical Exam Vitals and nursing note reviewed.  Constitutional:      General: He is not in acute distress.    Appearance: Normal appearance. He is not ill-appearing.  HENT:     Head: Normocephalic and atraumatic.  Eyes:     General:        Right eye: No discharge.        Left eye: No discharge.     Conjunctiva/sclera: Conjunctivae normal.  Cardiovascular:     Rate and Rhythm: Rhythm irregular.  Pulmonary:     Effort: Pulmonary effort is normal.     Breath sounds: Normal breath sounds. No wheezing, rhonchi or rales.  Neurological:     Mental Status: He is alert.  Psychiatric:        Mood and Affect: Mood normal.        Behavior: Behavior normal.    UC Treatments / Results  Labs (all labs ordered are listed, but only abnormal results are displayed) Labs Reviewed - No data to display  EKG Interpretation: Intermittently paced rhythm.  Underlying atrial fibrillation noted with intermittent conduction.  Radiology No results found.  Procedures Procedures (including critical care time)  Medications Ordered in UC Medications - No data to display  Initial Impression / Assessment and Plan / UC Course  I have reviewed the triage vital signs and the nursing notes.  Pertinent labs & imaging results that were available during my care of the patient were reviewed by me and considered in my medical decision making (see chart for details).    75 year old male presents with ongoing chest tightness, shortness of breath.  He has a history of atrial fibrillation.  He also has a pacemaker.  I am concerned that he may have an underlying issue with his pacemaker.  He has a history of pulmonary embolus.  This is also a concern.  Patient needs higher level of care.  He needs his pacemaker interrogated and will likely need further evaluation with laboratory  studies and likely CTA.  Patient is being transported to [facility] via EMS.  Final Clinical Impressions(s) / UC Diagnoses   Final diagnoses:  Atrial fibrillation, unspecified type ([place])  SOB (shortness of breath)   Discharge Instructions   None    ED Prescriptions    None     PDMP not reviewed this encounter.   Coral Spikes, DO 08/24/20 1320

## 2020-08-24 NOTE — ED Notes (Signed)
Patient is being discharged from the Urgent Care and sent to the Emergency Department via EMS. Per Dr. Lacinda Axon, patient is in need of higher level of care due to Afib. Patient is aware and verbalizes understanding of plan of care.  Vitals:   08/24/20 1212  BP: (!) 161/84  Pulse: 81  Resp: 18  Temp: 98.2 F (36.8 C)  SpO2: 99%

## 2020-08-24 NOTE — ED Triage Notes (Addendum)
Pt c/o chest tightness, dizziness and a feeling of anxiety for about a week. Pt does have a pacemaker and reports it has been working well. Pt did see his PCP recently and reported similar symptoms, had his pacemaker and thyroid checked and all were normal. 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 Pt also reports shob that increased with activity.

## 2020-09-06 DIAGNOSIS — I491 Atrial premature depolarization: Secondary | ICD-10-CM | POA: Insufficient documentation

## 2020-09-26 IMAGING — DX DG HAND COMPLETE 3+V*R*
3 series · 3 of 3 positions shown · non-contrast
Comparison: RIGHT index finger x-rays 07/04/2017. No prior RIGHT
hand imaging.

CLINICAL DATA: 74-year-old presenting with a cat bite to the RIGHT
hand that occurred earlier today. Initial encounter.

EXAM:
RIGHT HAND - COMPLETE 3+ VIEW

[hand ap]
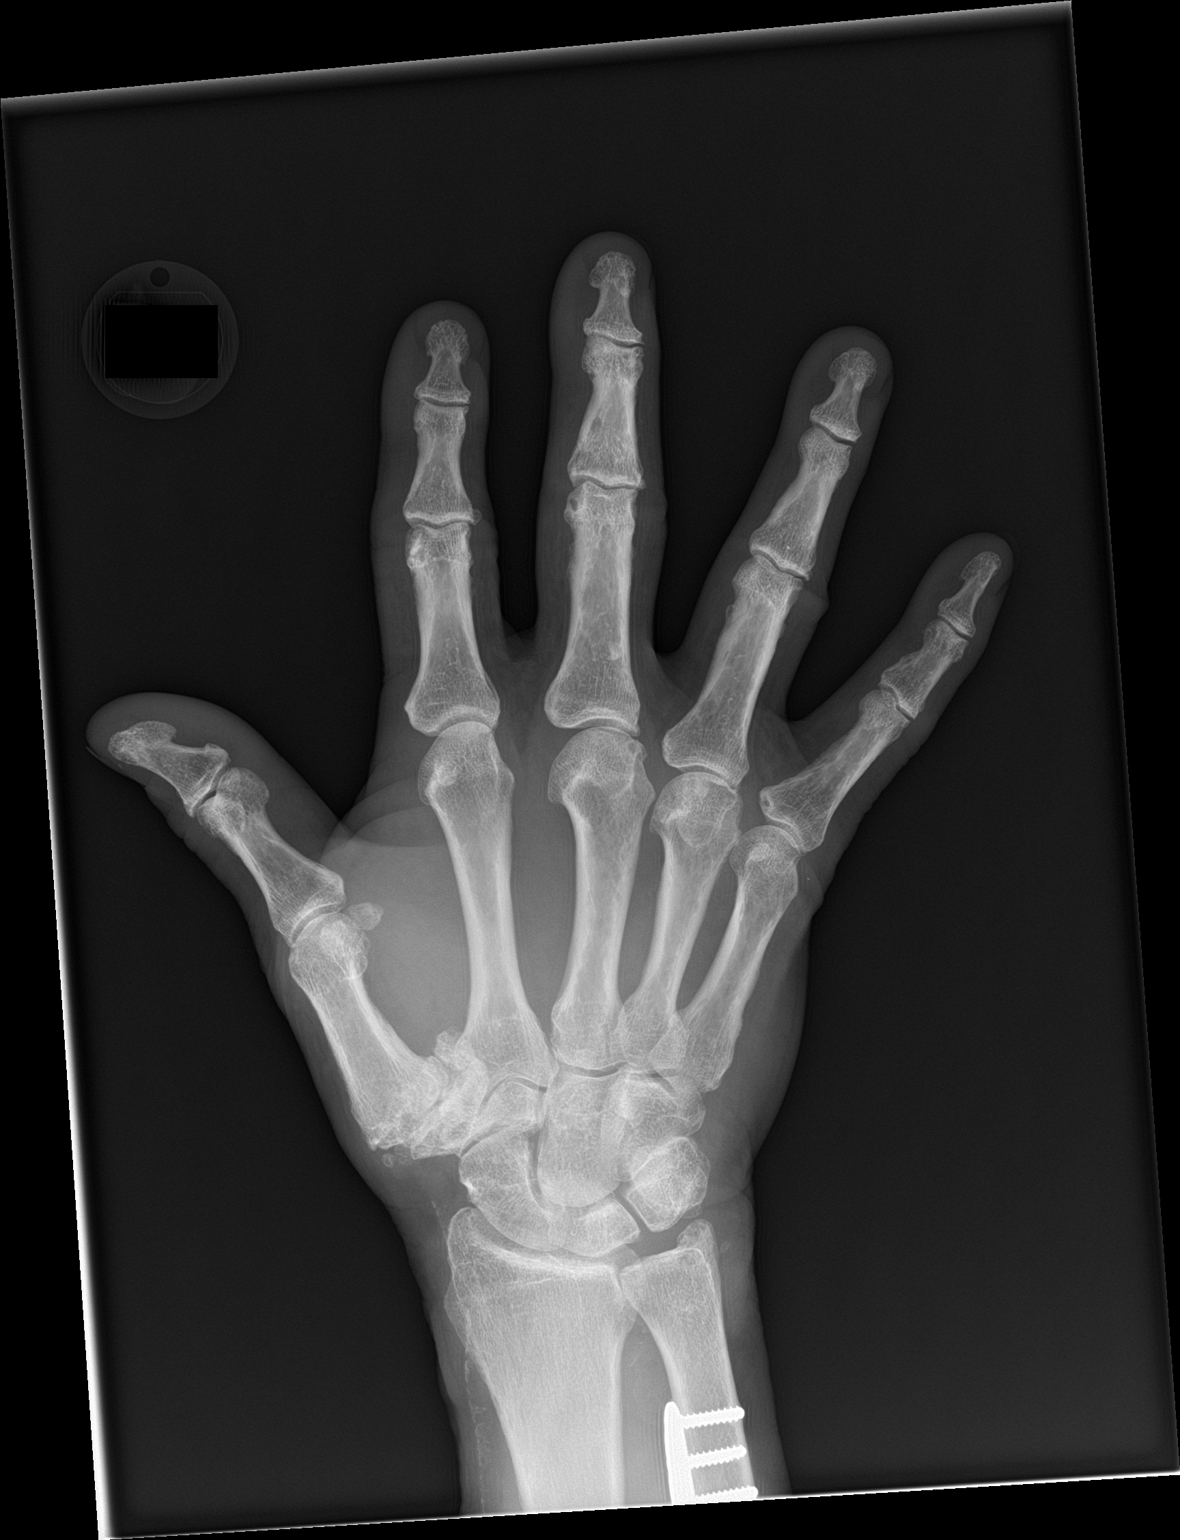

[hand lat]
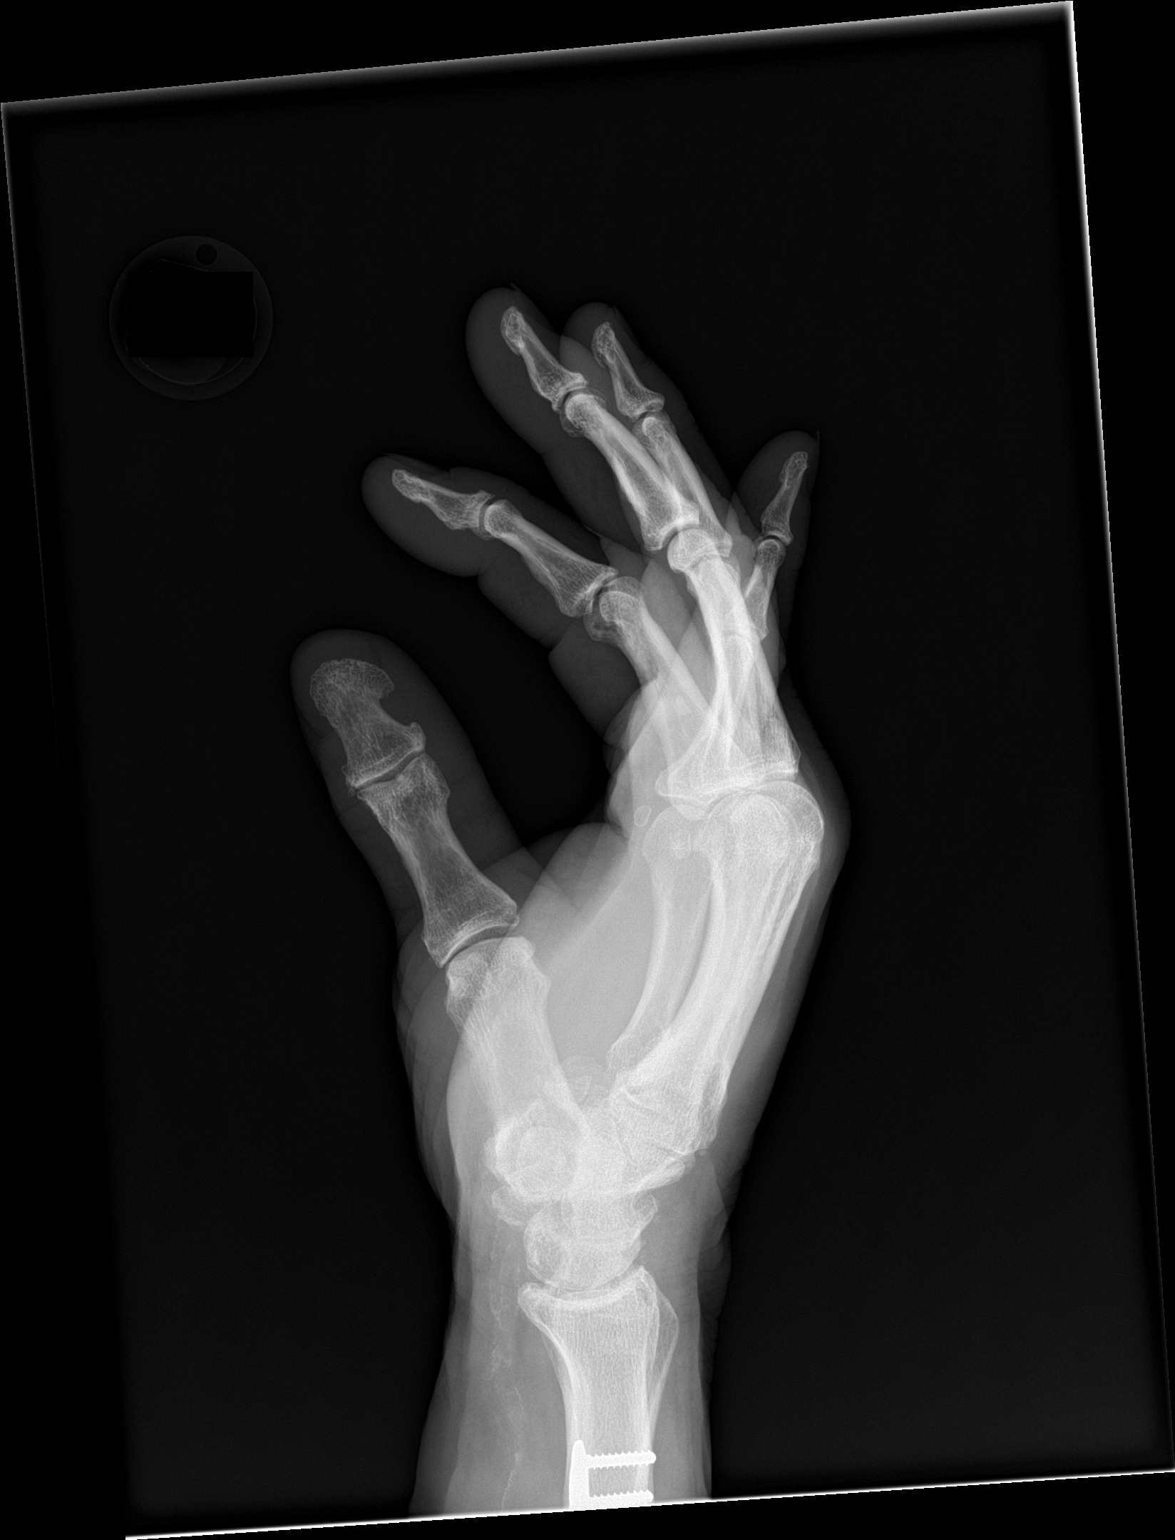

[hand obl]
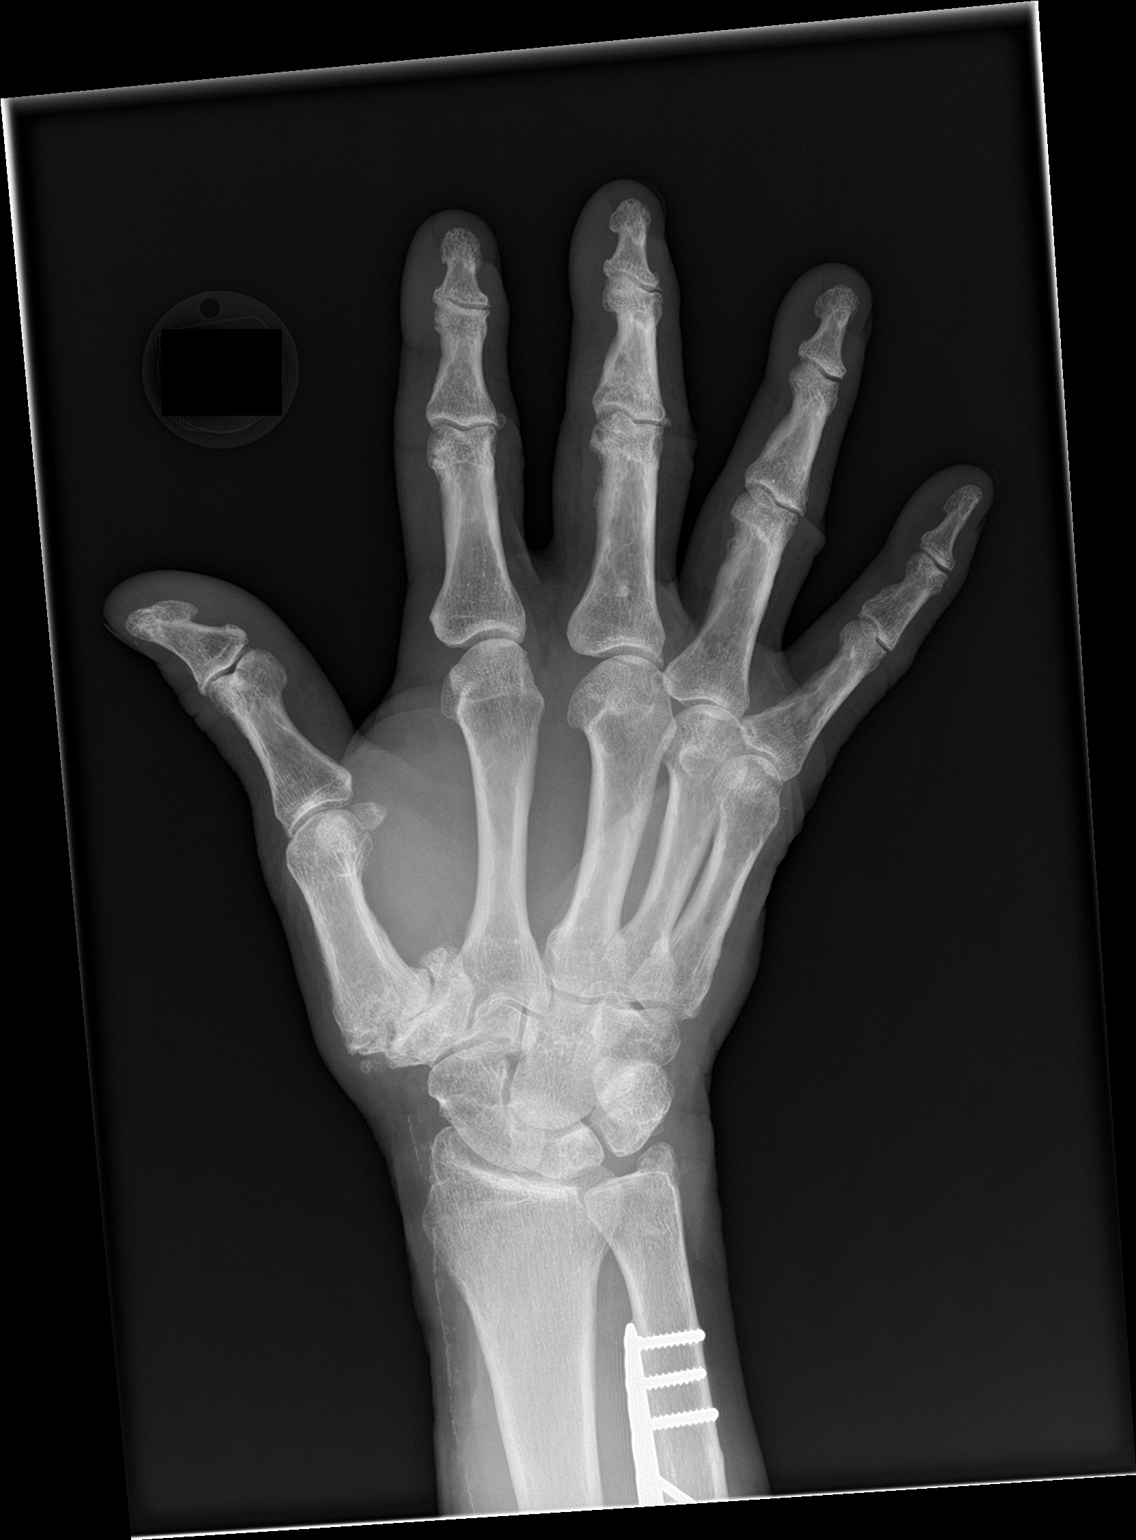

[3 of 3 positions shown; findings below may reference images not displayed]

FINDINGS: No evidence of acute fracture or dislocation. Narrowing of the IP
joints of the fingers and thumb. Severe narrowing of the
trapezium-first metacarpal joint space with intra-articular loose
bodies. Well-preserved bone mineral density. Prior ORIF of a distal
ulnar fracture which is incompletely imaged.
IMPRESSION: 1. No acute osseous abnormality.
2. Osteoarthritis involving the fingers and thumb. Severe
osteoarthritis involving the trapezium-first metacarpal joint.

## 2020-10-02 DIAGNOSIS — M1812 Unilateral primary osteoarthritis of first carpometacarpal joint, left hand: Secondary | ICD-10-CM | POA: Insufficient documentation

## 2020-10-02 DIAGNOSIS — G5621 Lesion of ulnar nerve, right upper limb: Secondary | ICD-10-CM | POA: Insufficient documentation

## 2020-10-08 ENCOUNTER — Other Ambulatory Visit: Payer: Self-pay | Admitting: Surgery

## 2020-10-15 DIAGNOSIS — Z7901 Long term (current) use of anticoagulants: Secondary | ICD-10-CM | POA: Insufficient documentation

## 2020-10-18 ENCOUNTER — Other Ambulatory Visit: Payer: Self-pay

## 2020-10-18 ENCOUNTER — Other Ambulatory Visit
Admission: RE | Admit: 2020-10-18 | Discharge: 2020-10-18 | Disposition: A | Discharge status: Home / Self Care | Payer: Medicare Other | Source: Ambulatory Visit | Attending: Surgery | Admitting: Surgery

## 2020-10-18 HISTORY — DX: Presence of cardiac pacemaker: Z95.0

## 2020-10-18 HISTORY — DX: Malignant (primary) neoplasm, unspecified: C80.1

## 2020-10-18 HISTORY — DX: Sleep apnea, unspecified: G47.30

## 2020-10-18 NOTE — Patient Instructions (Signed)
Your procedure is scheduled on: 10/23/20  Report to the Registration Desk on the 1st floor of the [facility]. To find out your arrival time, please call [phone] between 1PM - 3PM on:10/22/20  REMEMBER: Instructions that are not followed completely may result in serious medical risk, up to and including death; or upon the discretion of your surgeon and anesthesiologist your surgery may need to be rescheduled.  Do not eat food after midnight the night before surgery.  No gum chewing, lozengers or hard candies.  You may however, drink CLEAR liquids up to 2 hours before you are scheduled to arrive for your surgery. Do not drink anything within 2 hours of your scheduled arrival time.  Clear liquids include: - water  - apple juice without pulp - gatorade (not RED, PURPLE, OR BLUE) - black coffee or tea (Do NOT add milk or creamers to the coffee or tea) Do NOT drink anything that is not on this list.  TAKE THESE MEDICATIONS THE MORNING OF SURGERY WITH A SIP OF WATER:  - amLODipine (NORVASC) 5 MG tablet - carvedilol (COREG) 12.5 MG tablet - oxybutynin (DITROPAN-XL) 10 MG 24 hr tablet  Follow recommendations from Cardiologist, Pulmonologist or PCP regarding stopping Aspirin, Coumadin, Plavix, Eliquis, Pradaxa, or Pletal. Patient to stop taking beginning 10/21/20.  One week prior to surgery: Stop Anti-inflammatories (NSAIDS) such as Advil, Aleve, Ibuprofen, Motrin, Naproxen, Naprosyn and Aspirin based products such as Excedrin, Goodys Powder, BC Powder.  Stop ANY OVER THE COUNTER supplements until after surgery.  You may continue to take Tylenol if needed for pain up until the day of surgery.  No Alcohol for 24 hours before or after surgery.  No Smoking including e-cigarettes for 24 hours prior to surgery.  No chewable tobacco products for at least 6 hours prior to surgery.  No nicotine patches on the day of surgery.  Do not use any "recreational" drugs for at least a week  prior to your surgery.  Please be advised that the combination of cocaine and anesthesia may have negative outcomes, up to and including death. If you test positive for cocaine, your surgery will be cancelled.  On the morning of surgery brush your teeth with toothpaste and water, you may rinse your mouth with mouthwash if you wish. Do not swallow any toothpaste or mouthwash.  Do not wear jewelry, make-up, hairpins, clips or nail polish.  Do not wear lotions, powders, or perfumes.   Do not shave body from the neck down 48 hours prior to surgery just in case you cut yourself which could leave a site for infection.  Also, freshly shaved skin may become irritated if using the CHG soap.  Contact lenses, hearing aids and dentures may not be worn into surgery.  Do not bring valuables to the hospital. [facility] is not responsible for any missing/lost belongings or valuables.   Use CHG Soap or wipes as directed on instruction sheet.  Notify your doctor if there is any change in your medical condition (cold, fever, infection).  Wear comfortable clothing (specific to your surgery type) to the hospital.  Plan for stool softeners for home use; pain medications have a tendency to cause constipation. You can also help prevent constipation by eating foods high in fiber such as fruits and vegetables and drinking plenty of fluids as your diet allows.  After surgery, you can help prevent lung complications by doing breathing exercises.  Take deep breaths and cough every 1-2 hours. Your doctor may order a  device called an Incentive Spirometer to help you take deep breaths. When coughing or sneezing, hold a pillow firmly against your incision with both hands. This is called "splinting." Doing this helps protect your incision. It also decreases belly discomfort.  If you are being admitted to the hospital overnight, leave your suitcase in the car. After surgery it may be brought to your room.  If you  are being discharged the day of surgery, you will not be allowed to drive home. You will need a responsible adult (18 years or older) to drive you home and stay with you that night.   If you are taking public transportation, you will need to have a responsible adult (18 years or older) with you. Please confirm with your physician that it is acceptable to use public transportation.   Please call the [place] Dept. at [phone] if you have any questions about these instructions.  Surgery Visitation Policy:  Patients undergoing a surgery or procedure may have one family member or support person with them as long as that person is not COVID-19 positive or experiencing its symptoms.  That person may remain in the waiting area during the procedure.  Inpatient Visitation:    Visiting hours are 7 a.m. to 8 p.m. Inpatients will be allowed two visitors daily. The visitors may change each day during the patient's stay. No visitors under the age of 1. Any visitor under the age of 33 must be accompanied by an adult. The visitor must pass COVID-19 screenings, use hand sanitizer when entering and exiting the patient's room and wear a mask at all times, including in the patient's room. Patients must also wear a mask when staff or their visitor are in the room. Masking is required regardless of vaccination status.

## 2020-10-21 ENCOUNTER — Encounter: Payer: Self-pay | Admitting: Surgery

## 2020-10-21 NOTE — Progress Notes (Signed)
Perioperative Services Pre-Admission/Anesthesia Testing   Date: 10/21/20 Name: Ruben Willis MRN:   [id number]  Re: Consideration of preoperative prophylactic antibiotic change   Request sent to: Poggi, Marshall Cork, MD (routed and/or faxed via [facility])  Planned Surgical Procedure(s):    Case: [id number] Date/Time: 10/23/20 1033   Procedure: LEFT THUMB CMC SUSPENSION ARTHROPLASTY (Left Thumb)   Anesthesia type: Choice   Pre-op diagnosis: Primary osteoarthritis of first carpometacarpal joint of left hand M18.12   Location: [facility] [place] [facility]   Surgeons: Corky Mull, MD    Notes: 1. Patient has a documented allergy to PCN  . Advising that PCN has caused him to experience a nonspecific rash in the past.   2. Screened as appropriate for cephalosporin use during medication reconciliation . No immediate angioedema, dysphagia, SOB, anaphylaxis symptoms. . No severe rash involving mucous membranes or skin necrosis. . No hospital admissions related to side effects of PCN/cephalosporin use.  . No documented reaction to PCN or cephalosporin in the last 10 years.  Request:  As an evidence based approach to reducing the rate of incidence for post-operative SSI and the development of MDROs, could an agent with narrower coverage for preoperative prophylaxis in this patient's upcoming surgical course be considered?   1. Currently ordered preoperative prophylactic ABX: clindamycin.   2. Specifically requesting change to cephalosporin (CEFAZOLIN).   3. Please communicate decision with me and I will change the orders in Epic as per your direction.   Things to consider:  Many patients report that they were "allergic" to PCN earlier in life, however this does not translate into a true lifelong allergy. Patients can lose sensitivity to specific IgE antibodies over time if PCN is avoided (Kleris & Lugar, 2019).   Up to 10% of the adult population and 15% of hospitalized  patients report an allergy to PCN, however clinical studies suggest that 90% of those reporting an allergy can tolerate PCN antibiotics (Kleris & Lugar, 2019).   Cross-sensitivity between PCN and cephalosporins has been documented as being as high as 10%, however this estimation included data believed to have been collected in a setting where there was contamination. Newer data suggests that the prevalence of cross-sensitivity between PCN and cephalosporins is actually estimated to be closer to 1% (Hermanides et al., 2018).    Patients labeled as PCN allergic, whether they are truly allergic or not, have been found to have inferior outcomes in terms of rates of serious infection, and these patients tend to have longer hospital stays ([place], 2019).   Treatment related secondary infections, such as Clostridioides difficile, have been linked to the improper use of broad spectrum antibiotics in patients improperly labeled as PCN allergic (Kleris & Lugar, 2019).   Anaphylaxis from cephalosporins is rare and the evidence suggests that there is no increased risk of an anaphylactic type reaction when cephalosporins are used in a PCN allergic patient (Pichichero, 2006).  Citations: Hermanides J, Lemkes BA, Prins Pearla Dubonnet MW, Terreehorst I. Presumed ?-Lactam Allergy and Cross-reactivity in the Operating Theater: A Practical Approach. Anesthesiology. 2018 Aug;129(2):335-342. doi: 10.1097/ALN.0000000000002252. PMID: 41740814.  Kleris, [place]., & Lugar, P. L. (2019). Things We Do For No Reason: Failing to Question a Penicillin Allergy History. Journal of hospital medicine, 14(10), [phone]. Advance online publication. https://www.wallace-middleton.info/  Pichichero, M. E. (2006). Cephalosporins can be prescribed safely for penicillin-allergic patients. Journal of family medicine, 55(2), 106-112. Accessed: https://cdn.mdedge.com/files/s1fs-public/Document/September-2017/5502JFP_AppliedEvidence1.pdf    Honor Loh, MSN, APRN, FNP-C, CEN  [facility]  Peri-operative Services Nurse Practitioner FAX: [phone] 10/21/20 5:10 PM

## 2020-10-22 MED ORDER — LACTATED RINGERS IV SOLN: INTRAVENOUS | Status: DC

## 2020-10-22 MED ORDER — FAMOTIDINE 20 MG PO TABS: 20.0000 mg | ORAL_TABLET | Freq: Once | ORAL | Status: AC

## 2020-10-22 MED ORDER — ORAL CARE MOUTH RINSE: 15.0000 mL | Freq: Once | OROMUCOSAL | Status: AC

## 2020-10-22 MED ORDER — CHLORHEXIDINE GLUCONATE 0.12 % MT SOLN: 15.0000 mL | Freq: Once | OROMUCOSAL | Status: AC

## 2020-10-22 MED ORDER — CLINDAMYCIN PHOSPHATE 900 MG/50ML IV SOLN
900.0000 mg | INTRAVENOUS | Status: AC
  Administered 2020-10-23: 900 mg via INTRAVENOUS

## 2020-10-22 NOTE — Progress Notes (Signed)
Perioperative Services  Pre-Admission/Anesthesia Testing Clinical Review  Date: 10/22/20  Patient Demographics:  Name: Ruben Willis DOB:   [birth year] MRN:   [id number]  Planned Surgical Procedure(s):    Case: [id number] Date/Time: 10/23/20 1033   Procedure: LEFT THUMB CMC SUSPENSION ARTHROPLASTY (Left Thumb)   Anesthesia type: Choice   Pre-op diagnosis: Primary osteoarthritis of first carpometacarpal joint of left hand M18.12   Location: [facility] OR ROOM 02 / [place] [facility] FOR ANESTHESIA GROUP   Surgeons: Corky Mull, MD    NOTE: Available PAT nursing documentation and vital signs have been reviewed. Clinical nursing staff has updated patient's PMH/PSHx, current medication list, and drug allergies/intolerances to ensure comprehensive history available to assist in medical decision making as it pertains to the aforementioned surgical procedure and anticipated anesthetic course. Extensive review of available clinical information performed. [place] PMH and PSHx updated with any diagnoses/procedures that  may have been inadvertently omitted during his intake with the pre-admission testing department's nursing staff.  Clinical Discussion:  Ruben Willis is a 75 y.o. male who is submitted for pre-surgical anesthesia review and clearance prior to him undergoing the above procedure. Patient has never been a smoker. 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  Patient is followed by cardiology (Pasi, MD). He was last seen in the cardiology clinic on 09/19/2020; notes reviewed.  At the time of his clinic visit, patient was being seen for a follow run of symptomatic atrial fibrillation.  Patient reported that he had experienced chest pressure, dyspnea, and significant fatigue associated with this episode.  He denied any overt chest pain.  No PND, orthopnea, peripheral  edema, vertiginous symptoms, or presyncope/syncope.  CHA2DS2-VASc Score = 3 (age x 2, HTN).  Patient had been previously advised by cardiology to start on anticoagulation therapy (apixaban) the previous day, however he had not picked up the prescription.  Patient was advised by cardiology to start the apixaban as advised.  Patient being seen by cardiology colleague; decision regarding rate control versus ablation deferred to primary cardiologist.  Recent noninvasive cardiovascular testing as follows:   Myocardial perfusion imaging study performed and 04/2017 demonstrated normal left ventricular systolic function (EF 01%) with no evidence of stress-induced myocardial ischemia or arrhythmia.   Patient developed symptomatic bradycardia and heart block.  He underwent PPM placement (St. Jude) on 09/06/2018.   Underwent a diagnostic left heart catheterization on 08/26/2020 that revealed no evidence of CAD.  Procedure revealed very tortuous coronary anatomy.   TTE performed on 08/27/2020 revealed a normal left ventricular systolic function (LVEF >60%) with mild mitral valve regurgitation.   Exercise stress test performed on 08/27/2020 revealed no significant ST or T wave abnormalities.  There was occasional atrial ectopy noted.  Workload of 5.2 METS was achieved without angina/anginal equivalent symptoms (see full interpretation of cardiovascular testing below).  Patient on GDMT for his HTN and HLD diagnoses.  Blood pressure well controlled at 123/66 on currently prescribed CCB, ARB, and beta-blocker therapies.  Patient is on combination therapy (simvastatin + ezetimibe) for his HLD.  Patient is prediabetic; last Hgb A1c 6.0% when checked on 08/24/2020.  Additionally, patient has a history of OSAH syndrome, however following sinus surgery, patient no longer requires nocturnal PAP therapy.  ECG in the office revealed sinus rhythm with frequent ventricular paced beats.  Functional capacity, as defined by  DASI, is documented as being >/= 4 METS.  No changes were  made to patient's medication regimen.  Patient to follow-up with outpatient cardiology in 3 months or sooner if needed.  Patient is scheduled for an elective orthopedic (hand) procedure on 10/23/2020 with Dr. Milagros Evener.  Given patient's past medical history significant for cardiovascular diagnoses, presurgical cardiac clearance was sought by the PAT team.  Per cardiology, "this patient is medically optimized.  Patient at an overall LOW risk for cardiac events in the perioperative period.  Patient may proceed as planned". This patient is on daily anticoagulation therapy. He has been instructed on recommendations for holding his apixaban for 2 days prior to his procedure with plans to restart as soon as postoperative bleeding risk felt to be minimized by his attending surgeon. The patient has been instructed that his last dose of his anticoagulant will be on 10/20/2020.  Patient denies previous perioperative complications with anesthesia in the past. In review of the available records, it is noted that patient underwent a MAC anesthetic course at [facility] ([facility] III) in 05/2016 without documented complications.   Vitals with BMI 08/24/2020 11/01/2019 08/31/2019  Height _0  _1  -  Weight 210 lbs 210 lbs -  BMI 31 31 -  Systolic 347 425 956  Diastolic 84 59 86  Pulse 81 85 68    Providers/Specialists:   NOTE: Primary physician provider listed below. Patient may have been seen by APP or partner within same practice.   PROVIDER ROLE / SPECIALTY LAST OV  Poggi, Marshall Cork, MD  Orthopedics (Surgeon)  10/02/2020  Verdie Shire, MD  Primary Care Provider  09/06/2020  Lind Guest, MD  Cardiology  09/19/2020  Chevis Pretty, MD  Electrophysiology  10/02/2020   Allergies:  Penicillins, Morphine, and Nitroglycerin  Current Home Medications:   No current facility-administered medications for this encounter.   Marland Kitchen acetaminophen (TYLENOL) 500  MG tablet  . amLODipine (NORVASC) 5 MG tablet  . apixaban (ELIQUIS) 5 MG TABS tablet  . candesartan (ATACAND) 16 MG tablet  . carvedilol (COREG) 12.5 MG tablet  . Cholecalciferol 25 MCG (1000 UT) capsule  . ezetimibe-simvastatin (VYTORIN) 10-20 MG per tablet  . fluticasone (FLONASE) 50 MCG/ACT nasal spray  . Glucosamine-Chondroitin (OSTEO BI-FLEX REGULAR STRENGTH PO)  . Lutein 6 MG CAPS  . Multiple Vitamin (MULTI-VITAMIN) tablet  . oxybutynin (DITROPAN-XL) 10 MG 24 hr tablet   History:   Past Medical History:  Diagnosis Date  . Arthritis   . Cataract, bilateral   . Complete heart block ([place])    s/p PPM placement  . ED (erectile dysfunction)   . History of BPH    s/p TURP  . History of hiatal hernia    surgically repaired  . History of obstructive sleep apnea    resolved following sinus surgery  . Hypercholesteremia   . Hyperlipidemia   . Hypertension   . Paroxysmal atrial fibrillation (HCC)   . Pre-diabetes   . Presence of permanent cardiac pacemaker    St.Jude Dual Chamber  . Pulmonary embolism ([place]) 06/09/2000  . Sinus node dysfunction (HCC)   . Skin cancer    removed from nose  . SOB (shortness of breath)    Past Surgical History:  Procedure Laterality Date  . APPENDECTOMY    . CARDIAC CATHETERIZATION  08/13/2011  . CATARACT EXTRACTION, BILATERAL    . COLONOSCOPY  2014  . COSMETIC SURGERY  05/26/2016   eye lids  . HAND SURGERY    . HERNIA REPAIR     HIATAL  . KNEE SURGERY    .  NASAL SEPTUM SURGERY    . OSTEOTOMY AND ULNAR SHORTENING    . PACEMAKER PLACEMENT  08/2018   Dr. Trinna Balloon Bumgarner  . PALATE SURGERY    . PROSTATE SURGERY    . SKIN CANCER EXCISION    . SKIN GRAFT    . VASECTOMY     Family History  Problem Relation Age of Onset  . Diabetes Mother   . Heart disease Mother   . Aneurysm Father        brain  . CAD Father    Social History   Tobacco Use  . Smoking status: Never Smoker  . Smokeless tobacco: Never Used  Vaping Use  .  Vaping Use: Never used  Substance Use Topics  . Alcohol use: No  . Drug use: No    Pertinent Clinical Results:  LABS: Labs reviewed: Acceptable for surgery.   Ref Range & Units 1 mo ago  WBC 3.6 - 11.2 10*9/L 5.4   RBC 4.26 - 5.60 10*12/L 4.71   HGB 12.9 - 16.5 g/dL 13.5   HCT 39.0 - 48.0 % 40.5   MCV 77.6 - 95.7 fL 86.0   MCH 25.9 - 32.4 pg 28.6   MCHC 32.0 - 36.0 g/dL 33.2   RDW 12.2 - 15.2 % 14.2   MPV 6.8 - 10.7 fL 7.7   Platelet 150 - 450 10*9/L 268   Resulting Agency  [facility]  Specimen Collected: 08/27/20 7:30 AM Last Resulted: 08/27/20 8:46 AM  Received From: [place]  Result Received: 10/08/20 10:22 AM    Ref Range & Units 1 mo ago  Sodium 135 - 145 mmol/L 138   Potassium 3.4 - 4.8 mmol/L 4.0   Chloride 98 - 107 mmol/L 106   CO2 20.0 - 31.0 mmol/L 26.0   Anion Gap 5 - 14 mmol/L 6   BUN 9 - 23 mg/dL 21   Creatinine 0.60 - 1.10 mg/dL 1.31High   BUN/Creatinine Ratio  16   EGFR CKD-EPI Non-African American, Male >=60 mL/min/1.73m 53Low   EGFR CKD-EPI African American, Male >=60 mL/min/1.760m61   Glucose 70 - 179 mg/dL 86   Calcium 8.7 - 10.4 mg/dL 9.3   [facility]  Specimen Collected: 08/27/20 7:30 AM Last Resulted: 08/27/20 9:17 AM  Received From: [facility] Received: 10/08/20 10:22 AM    ECG: Date: 08/24/2020 Time ECG obtained: 1213 PM Rate: 78 bpm Rhythm: Atrial fibrillation with PVCs Axis (leads I and aVF): Normal Intervals: QRS 100 ms. QTc 421 ms. ST segment and T wave changes: No evidence of acute ST segment elevation or depression Comparison: Atrial fibrillation has replaced normal sinus rhythm when compared to tracing from 08/04/2019    IMAGING / PROCEDURES: EXERCISE STRESS TEST performed on 08/27/2020 1. Estimated workload of 5.2 METS with no angina or anginal equivalent symptoms 2. Few PACs noted with peak exercise; otherwise no abnormal  arrhythmias noted with exercise via pacemaker interrogation 3. Resting: BP = 139/79; HR = 60 bpm 4. Peak/stress: BP = 154/74, HR = 115 bpm (79% PMHR) 5. No significant ST shifts or T wave abnormalities  TRANSTHORACIC ECHOCARDIOGRAM performed on 08/27/2020 1. LVEF >55% 2. Left ventricular systolic function was normal 3. Left ventricle was normal in size with normal wall thickness 4. The mitral valve leaflets were mildly thickened with normal leaflet mobility 5. Mild mitral valve regurgitation 6. Aortic valve is trileaflet with mildly thickened leaflets and normal excursion 7.  The right ventricle is normal in size with normal systolic function 8. No evidence of a pericardial effusion 9. IVC and inspiratory change suggest low right atrial pressure (<3 mmHg) 10. The aorta is normal size in the visualized segments  LEFT HEART CATHETERIZATION AND CORONARY ANGIOGRAPHY performed on 08/26/2020 1. No CAD 2. Very tortuous coronary vessels 3. Recommendations: Ongoing management for chest pain frequent PVCs  Impression and Plan:  Cheney Ewart has been referred for pre-anesthesia review and clearance prior to him undergoing the planned anesthetic and procedural courses. Available labs, pertinent testing, and imaging results were personally reviewed by me. This patient has been appropriately cleared by cardiology with an overall LOW risk of significant perioperative cardiovascular complications.  Based on clinical review performed today (10/22/20), barring any significant acute changes in the patient's overall condition, it is anticipated that he will be able to proceed with the planned surgical intervention. Any acute changes in clinical condition may necessitate his procedure being postponed and/or cancelled. Patient will meet with anesthesia team (MD and/or CRNA) on the day of his procedure for preoperative evaluation/assessment. Questions regarding anesthetic course will be fielded at that time.    Pre-surgical instructions were reviewed with the patient during his PAT appointment and questions were fielded by PAT clinical staff. Patient was advised that if any questions or concerns arise prior to his procedure then he should return a call to PAT and/or his surgeon's office to discuss.  Honor Loh, MSN, APRN, FNP-C, CEN [facility]  Peri-operative Services Nurse Practitioner Phone: [phone] 10/22/20 8:19 AM  NOTE: This note has been prepared using Dragon dictation software. Despite my best ability to proofread, there is always the potential that unintentional transcriptional errors may still occur from this process.

## 2020-10-23 ENCOUNTER — Ambulatory Visit: Payer: Medicare Other

## 2020-10-23 ENCOUNTER — Ambulatory Visit: Payer: Medicare Other | Admitting: Urgent Care

## 2020-10-23 ENCOUNTER — Other Ambulatory Visit: Payer: Self-pay

## 2020-10-23 ENCOUNTER — Encounter
Admission: RE | Disposition: A | Discharge status: Home / Self Care | Payer: Self-pay | Source: Home / Self Care | Attending: Surgery

## 2020-10-23 ENCOUNTER — Ambulatory Visit
Admission: RE | Admit: 2020-10-23 | Discharge: 2020-10-23 | Disposition: A | Discharge status: Home / Self Care | Payer: Medicare Other | Attending: Surgery | Admitting: Surgery

## 2020-10-23 ENCOUNTER — Encounter: Payer: Self-pay | Admitting: Surgery

## 2020-10-23 DIAGNOSIS — M1812 Unilateral primary osteoarthritis of first carpometacarpal joint, left hand: Secondary | ICD-10-CM | POA: Insufficient documentation

## 2020-10-23 DIAGNOSIS — Z79899 Other long term (current) drug therapy: Secondary | ICD-10-CM | POA: Diagnosis not present

## 2020-10-23 DIAGNOSIS — Z87891 Personal history of nicotine dependence: Secondary | ICD-10-CM | POA: Diagnosis not present

## 2020-10-23 DIAGNOSIS — Z885 Allergy status to narcotic agent status: Secondary | ICD-10-CM | POA: Diagnosis not present

## 2020-10-23 DIAGNOSIS — Z880 Allergy status to penicillin: Secondary | ICD-10-CM | POA: Diagnosis not present

## 2020-10-23 DIAGNOSIS — Z7982 Long term (current) use of aspirin: Secondary | ICD-10-CM | POA: Diagnosis not present

## 2020-10-23 DIAGNOSIS — Z888 Allergy status to other drugs, medicaments and biological substances status: Secondary | ICD-10-CM | POA: Diagnosis not present

## 2020-10-23 DIAGNOSIS — Z7901 Long term (current) use of anticoagulants: Secondary | ICD-10-CM | POA: Diagnosis not present

## 2020-10-23 HISTORY — DX: Sick sinus syndrome: I49.5

## 2020-10-23 HISTORY — DX: Personal history of other diseases of the nervous system and sense organs: Z86.69

## 2020-10-23 HISTORY — DX: Unspecified malignant neoplasm of skin, unspecified: C44.90

## 2020-10-23 HISTORY — DX: Shortness of breath: R06.02

## 2020-10-23 HISTORY — DX: Paroxysmal atrial fibrillation: I48.0

## 2020-10-23 HISTORY — DX: Atrioventricular block, complete: I44.2

## 2020-10-23 HISTORY — PX: CARPOMETACARPEL SUSPENSION PLASTY: SHX5005

## 2020-10-23 HISTORY — DX: Male erectile dysfunction, unspecified: N52.9

## 2020-10-23 HISTORY — DX: Prediabetes: R73.03

## 2020-10-23 HISTORY — DX: Personal history of other diseases of male genital organs: Z87.438

## 2020-10-23 SURGERY — CARPOMETACARPEL (CMC) SUSPENSION PLASTY
Anesthesia: General | Site: Thumb | Laterality: Left

## 2020-10-23 MED ORDER — FENTANYL CITRATE (PF) 100 MCG/2ML IJ SOLN
INTRAMUSCULAR | Status: DC | PRN
  Administered 2020-10-23: 25 ug via INTRAVENOUS
  Administered 2020-10-23: 50 ug via INTRAVENOUS
  Administered 2020-10-23: 25 ug via INTRAVENOUS

## 2020-10-23 MED ORDER — FENTANYL CITRATE (PF) 100 MCG/2ML IJ SOLN
INTRAMUSCULAR | Status: AC
  Administered 2020-10-23: 25 ug via INTRAVENOUS
  Filled 2020-10-23: qty 2

## 2020-10-23 MED ORDER — KETAMINE HCL 10 MG/ML IJ SOLN
INTRAMUSCULAR | Status: DC | PRN
  Administered 2020-10-23: 10 mg via INTRAVENOUS
  Administered 2020-10-23: 20 mg via INTRAVENOUS

## 2020-10-23 MED ORDER — LIDOCAINE HCL (PF) 2 % IJ SOLN
INTRAMUSCULAR | Status: AC
  Filled 2020-10-23: qty 5

## 2020-10-23 MED ORDER — PHENYLEPHRINE HCL (PRESSORS) 10 MG/ML IV SOLN
INTRAVENOUS | Status: DC | PRN
  Administered 2020-10-23 (×2): 200 ug via INTRAVENOUS
  Administered 2020-10-23 (×3): 100 ug via INTRAVENOUS

## 2020-10-23 MED ORDER — FAMOTIDINE 20 MG PO TABS
ORAL_TABLET | ORAL | Status: AC
  Administered 2020-10-23: 20 mg via ORAL
  Filled 2020-10-23: qty 1

## 2020-10-23 MED ORDER — CLINDAMYCIN PHOSPHATE 900 MG/50ML IV SOLN
INTRAVENOUS | Status: AC
  Filled 2020-10-23: qty 50

## 2020-10-23 MED ORDER — EPHEDRINE SULFATE 50 MG/ML IJ SOLN
INTRAMUSCULAR | Status: DC | PRN
  Administered 2020-10-23: 10 mg via INTRAVENOUS
  Administered 2020-10-23 (×3): 5 mg via INTRAVENOUS

## 2020-10-23 MED ORDER — DEXAMETHASONE SODIUM PHOSPHATE 10 MG/ML IJ SOLN
INTRAMUSCULAR | Status: DC | PRN
  Administered 2020-10-23: 6 mg via INTRAVENOUS

## 2020-10-23 MED ORDER — ONDANSETRON HCL 4 MG/2ML IJ SOLN: 4.0000 mg | Freq: Four times a day (QID) | INTRAMUSCULAR | Status: DC | PRN

## 2020-10-23 MED ORDER — KETOROLAC TROMETHAMINE 30 MG/ML IJ SOLN: INTRAMUSCULAR | Status: DC | PRN

## 2020-10-23 MED ORDER — CHLORHEXIDINE GLUCONATE 0.12 % MT SOLN
OROMUCOSAL | Status: AC
  Administered 2020-10-23: 15 mL via OROMUCOSAL
  Filled 2020-10-23: qty 15

## 2020-10-23 MED ORDER — ACETAMINOPHEN 10 MG/ML IV SOLN
INTRAVENOUS | Status: AC
  Filled 2020-10-23: qty 100

## 2020-10-23 MED ORDER — ACETAMINOPHEN 10 MG/ML IV SOLN
INTRAVENOUS | Status: DC | PRN
  Administered 2020-10-23: 1000 mg via INTRAVENOUS

## 2020-10-23 MED ORDER — KETOROLAC TROMETHAMINE 15 MG/ML IJ SOLN
INTRAMUSCULAR | Status: AC
  Filled 2020-10-23: qty 1

## 2020-10-23 MED ORDER — SODIUM CHLORIDE (PF) 0.9 % IJ SOLN
INTRAMUSCULAR | Status: AC
  Filled 2020-10-23: qty 10

## 2020-10-23 MED ORDER — FENTANYL CITRATE (PF) 100 MCG/2ML IJ SOLN
25.0000 ug | INTRAMUSCULAR | Status: DC | PRN
  Administered 2020-10-23 (×2): 25 ug via INTRAVENOUS

## 2020-10-23 MED ORDER — KETOROLAC TROMETHAMINE 15 MG/ML IJ SOLN
15.0000 mg | Freq: Once | INTRAMUSCULAR | Status: AC
  Administered 2020-10-23: 15 mg via INTRAVENOUS

## 2020-10-23 MED ORDER — GLYCOPYRROLATE 0.2 MG/ML IJ SOLN
INTRAMUSCULAR | Status: DC | PRN
  Administered 2020-10-23: .2 mg via INTRAVENOUS

## 2020-10-23 MED ORDER — POTASSIUM CHLORIDE IN NACL 20-0.9 MEQ/L-% IV SOLN
INTRAVENOUS | Status: DC
  Filled 2020-10-23 (×3): qty 1000

## 2020-10-23 MED ORDER — FENTANYL CITRATE (PF) 100 MCG/2ML IJ SOLN
INTRAMUSCULAR | Status: AC
  Filled 2020-10-23: qty 2

## 2020-10-23 MED ORDER — ACETAMINOPHEN 10 MG/ML IV SOLN: 1000.0000 mg | Freq: Once | INTRAVENOUS | Status: DC | PRN

## 2020-10-23 MED ORDER — ONDANSETRON HCL 4 MG/2ML IJ SOLN
INTRAMUSCULAR | Status: AC
  Filled 2020-10-23: qty 2

## 2020-10-23 MED ORDER — OXYCODONE HCL 5 MG PO TABS
ORAL_TABLET | ORAL | Status: AC
  Filled 2020-10-23: qty 1

## 2020-10-23 MED ORDER — BUPIVACAINE HCL (PF) 0.5 % IJ SOLN
INTRAMUSCULAR | Status: DC | PRN
  Administered 2020-10-23: 10 mL

## 2020-10-23 MED ORDER — LIDOCAINE HCL (CARDIAC) PF 100 MG/5ML IV SOSY
PREFILLED_SYRINGE | INTRAVENOUS | Status: DC | PRN
  Administered 2020-10-23: 100 mg via INTRAVENOUS

## 2020-10-23 MED ORDER — ONDANSETRON HCL 4 MG PO TABS: 4.0000 mg | ORAL_TABLET | Freq: Four times a day (QID) | ORAL | Status: DC | PRN

## 2020-10-23 MED ORDER — OXYCODONE HCL 5 MG PO TABS
5.0000 mg | ORAL_TABLET | Freq: Once | ORAL | Status: AC | PRN
  Administered 2020-10-23: 5 mg via ORAL

## 2020-10-23 MED ORDER — ONDANSETRON HCL 4 MG/2ML IJ SOLN
INTRAMUSCULAR | Status: DC | PRN
  Administered 2020-10-23: 4 mg via INTRAVENOUS

## 2020-10-23 MED ORDER — METOCLOPRAMIDE HCL 5 MG/ML IJ SOLN: 5.0000 mg | Freq: Three times a day (TID) | INTRAMUSCULAR | Status: DC | PRN

## 2020-10-23 MED ORDER — BUPIVACAINE HCL (PF) 0.5 % IJ SOLN
INTRAMUSCULAR | Status: AC
  Filled 2020-10-23: qty 30

## 2020-10-23 MED ORDER — METOCLOPRAMIDE HCL 10 MG PO TABS
5.0000 mg | ORAL_TABLET | Freq: Three times a day (TID) | ORAL | Status: DC | PRN
Start: 2020-10-23 — End: 2020-10-23

## 2020-10-23 MED ORDER — PROPOFOL 10 MG/ML IV BOLUS
INTRAVENOUS | Status: DC | PRN
  Administered 2020-10-23: 120 mg via INTRAVENOUS

## 2020-10-23 MED ORDER — OXYCODONE HCL 5 MG/5ML PO SOLN: 5.0000 mg | Freq: Once | ORAL | Status: AC | PRN

## 2020-10-23 MED ORDER — ONDANSETRON HCL 4 MG/2ML IJ SOLN: 4.0000 mg | Freq: Once | INTRAMUSCULAR | Status: DC | PRN

## 2020-10-23 MED ORDER — EPHEDRINE 5 MG/ML INJ
INTRAVENOUS | Status: AC
  Filled 2020-10-23: qty 10

## 2020-10-23 MED ORDER — DEXAMETHASONE SODIUM PHOSPHATE 10 MG/ML IJ SOLN
INTRAMUSCULAR | Status: AC
  Filled 2020-10-23: qty 1

## 2020-10-23 SURGICAL SUPPLY — 42 items
BLADE DEBAKEY 8.0 (BLADE) ×2 IMPLANT
BLADE OSC/SAGITTAL 5.5X25 (BLADE) ×2 IMPLANT
BLADE SURG 15 STRL LF DISP TIS (BLADE) ×2 IMPLANT
BLADE SURG 15 STRL SS (BLADE) ×2
BNDG COHESIVE 4X5 TAN STRL (GAUZE/BANDAGES/DRESSINGS) ×2 IMPLANT
BNDG CONFORM 3 STRL LF (GAUZE/BANDAGES/DRESSINGS) ×2 IMPLANT
BNDG ELASTIC 3X5.8 VLCR STR LF (GAUZE/BANDAGES/DRESSINGS) ×2 IMPLANT
BNDG ESMARK 4X12 TAN STRL LF (GAUZE/BANDAGES/DRESSINGS) ×2 IMPLANT
BUR 4X55 1 (BURR) ×2 IMPLANT
CAST PADDING 3X4FT ST 30246 (SOFTGOODS) ×2
COVER WAND RF STERILE (DRAPES) ×2 IMPLANT
CUFF TOURN SGL QUICK 18 (TOURNIQUET CUFF) ×2 IMPLANT
CUFF TOURN SGL QUICK 18X4 (TOURNIQUET CUFF) ×2 IMPLANT
DRAPE FLUOR MINI C-ARM 54X84 (DRAPES) ×2 IMPLANT
DURAPREP 26ML APPLICATOR (WOUND CARE) ×4 IMPLANT
FORCEPS JEWEL BIP 4-3/4 STR (INSTRUMENTS) ×2 IMPLANT
GAUZE XEROFORM 1X8 LF (GAUZE/BANDAGES/DRESSINGS) ×2 IMPLANT
GLOVE SURG ENC MOIS LTX SZ8 (GLOVE) ×4 IMPLANT
GLOVE SURG UNDER LTX SZ8 (GLOVE) ×2 IMPLANT
GOWN STRL REUS W/ TWL XL LVL3 (GOWN DISPOSABLE) ×1 IMPLANT
GOWN STRL REUS W/TWL XL LVL3 (GOWN DISPOSABLE) ×1
KIT TURNOVER KIT A (KITS) ×2 IMPLANT
MANIFOLD NEPTUNE II (INSTRUMENTS) ×2 IMPLANT
NS IRRIG 500ML POUR BTL (IV SOLUTION) ×2 IMPLANT
PACK EXTREMITY ARMC (MISCELLANEOUS) ×2 IMPLANT
PAD CAST CTTN 3X4 STRL (SOFTGOODS) ×2 IMPLANT
PASSER SUT SWANSON 36MM LOOP (INSTRUMENTS) IMPLANT
SPLINT CAST 1 STEP 4X15 (MISCELLANEOUS) ×2 IMPLANT
STOCKINETTE IMPERVIOUS 9X36 MD (GAUZE/BANDAGES/DRESSINGS) ×2 IMPLANT
STOCKINETTE STRL 4IN 9604848 (GAUZE/BANDAGES/DRESSINGS) ×2 IMPLANT
STRIP CLOSURE SKIN 1/2X4 (GAUZE/BANDAGES/DRESSINGS) ×2 IMPLANT
STRIP CLOSURE SKIN 1/4X4 (GAUZE/BANDAGES/DRESSINGS) ×2 IMPLANT
SUT ETHIBOND 0 MO6 C/R (SUTURE) ×2 IMPLANT
SUT PROLENE 4 0 PS 2 18 (SUTURE) ×2 IMPLANT
SUT VIC AB 2-0 CT2 27 (SUTURE) ×2 IMPLANT
SUT VIC AB 2-0 SH 27 (SUTURE) ×1
SUT VIC AB 2-0 SH 27XBRD (SUTURE) ×1 IMPLANT
SUT VIC AB 3-0 SH 27 (SUTURE) ×1
SUT VIC AB 3-0 SH 27X BRD (SUTURE) ×1 IMPLANT
SWABSTK COMLB BENZOIN TINCTURE (MISCELLANEOUS) ×2 IMPLANT
SYSTEM IMPLANT TIGHTROPE MINI (Anchor) ×2 IMPLANT
WIRE Z .062 C-WIRE SPADE TIP (WIRE) IMPLANT

## 2020-10-23 NOTE — Discharge Instructions (Addendum)
Orthopedic discharge instructions: Keep splint dry and intact. Keep hand elevated above heart level. Apply ice to affected area frequently. Take ibuprofen 600-800 mg TID with meals for 7-10 days, then as necessary. Take ES Tylenol when needed.  Return for follow-up in 10-14 days or as scheduled.  AMBULATORY SURGERY  DISCHARGE INSTRUCTIONS   1) The drugs that you were given will stay in your system until tomorrow so for the next 24 hours you should not:  A) Drive an automobile B) Make any legal decisions C) Drink any alcoholic beverage   2) You may resume regular meals tomorrow.  Today it is better to start with liquids and gradually work up to solid foods.  You may eat anything you prefer, but it is better to start with liquids, then soup and crackers, and gradually work up to solid foods.   3) Please notify your doctor immediately if you have any unusual bleeding, trouble breathing, redness and pain at the surgery site, drainage, fever, or pain not relieved by medication.    4) Additional Instructions:        Please contact your physician with any problems or Same Day Surgery at [phone], Monday through Friday 6 am to 4 pm, or [place] at [facility] number at [phone].

## 2020-10-23 NOTE — H&P (Signed)
HPI:  Ruben Willis is a 75 y.o. male who is being referred by Reche Dixon, PA-C, for left basilar thumb pain. The patient notes that the symptoms have been present for several years now but of been worsening over the past month or so. His symptoms are aggravated by any repetitive activities or by grasping or clenching activities. He localizes the pain to the base of his left thumb. He saw Reche Dixon, PA-C, 7 weeks ago who diagnosed him with [facility] arthritis by x-rays, and referred him to me for further evaluation and treatment. The patient denies any specific injury to the thumb, but notes that he is quite active. He denies any numbness or paresthesias to his fingers.  Current Outpatient Medications: . apixaban (ELIQUIS) 5 mg tablet  . acetaminophen (TYLENOL) 325 MG tablet Take 650 mg by mouth nightly  . amLODIPine (NORVASC) 5 MG tablet Take 1 tablet (5 mg total) by mouth once daily for blood pressure 90 tablet 3  . aspirin 81 MG EC tablet Take 81 mg by mouth once daily  . candesartan (ATACAND) 16 MG tablet Take 1 tablet (16 mg total) by mouth 2 (two) times daily for blood pressure 180 tablet 3  . carvediloL (COREG) 12.5 MG tablet Take 12.5 mg by mouth 2 (two) times daily with meals  . cholecalciferol (VITAMIN D3) 2,000 unit capsule Take 2,000 Units by mouth 2 (two) times daily  . ezetimibe-simvastatin (VYTORIN) 10-20 mg tablet Take 1 tablet by mouth nightly for cholesterol 90 tablet 1  . fluticasone propionate (FLONASE) 50 mcg/actuation nasal spray Place 2 sprays into both nostrils once daily as needed for Rhinitis  . glucosamine/chondr su A sod (OSTEO BI-FLEX ORAL) Take 1 capsule by mouth once daily  . lutein 6 mg Cap Take 1 capsule by mouth every morning  . multivitamin tablet Take 1 tablet by mouth once daily  . oxybutynin (DITROPAN-XL) 10 MG XL tablet Take 10 mg by mouth every morning   Allergies:  . Nitroglycerin Other (Low BP)  . Opioids - Morphine Analogues Other (hypoglycerin)  .  Penicillin Rash   Past Medical History:  . Benign prostatic hyperplasia without urinary obstruction 06/24/2011 S/P TURP 12/2003  . CMC arthritis 09/06/2020 (Bilateral)  . ED (erectile dysfunction) of organic origin 10/25/2012  . Essential hypertension 11/21/2019  . Exertional chest pain 08/10/2018 (Cardiac catheterization at [facility] 08/26/2020 showed no evidence of coronary artery disease)  . History of complete heart block 11/21/2019(S/P pacemaker)  . History of PSVT (paroxysmal supraventricular tachycardia) 08/05/2020  . Hyperlipidemia 11/21/2019  . Impaired glucose tolerance 09/06/2020 (Hemoglobin A1c 6.0% on 08/24/2020)  . OSA (obstructive sleep apnea) - Resolved following septoplasty and UPPP  . Paroxysmal atrial fibrillation (CMS-HCC) 11/21/2019  . Perennial allergic rhinitis  . Pulmonary embolism (CMS-HCC) 06/09/2000 (Negative hypercoagulability evaluation)  . Urge incontinence 04/10/2014  . Vitamin D deficiency 04/10/2015   Past Surgical History:  . APPENDECTOMY 1965  . ARTHROSCOPY KNEE Left 2008  . CIRCUMCISION NEWBORN 1947  . EXTRACTION CATARACT EXTRACAPSULAR W/INSERTION INTRAOCULAR PROSTHESIS Right  . EXTRACTION CATARACT EXTRACAPSULAR W/INSERTION INTRAOCULAR PROSTHESIS Left  . INSERTION SINGLE CHAMBER PACEMAKER GENERATOR 09/06/2018  . MOHS SURGERY N/A 04/16/2020 for basal cell carcinoma on the nose, including paramedian forehead flap by Dr. Onalee Hua  . REPAIR BROW PTOSIS Bilateral 05/26/2016  . REPAIR HIATAL HERNIA 2007  . REPAIR INGUINAL HERNIA Bilateral 2002  . right ulnar shortening osteotomy Right 07/04/2013  . SEPTOPLASTY 09/29/2007  . UVULOPALATOPLASTY N/A 11/13/2007  . VASECTOMY Bilateral 1976  Family History:  . Diabetes type II Mother  . Heart disease Mother  . High blood pressure (Hypertension) Mother  . ESRD-Dialysis Mother  . Heart disease Father (S/P CABG x 5)  . High blood pressure (Hypertension) Father  . Diabetes type II Maternal Grandmother (S/P bilateral LE  amputations)  . Diabetes type II Daughter  . No Known Problems Daughter  . No Known Problems Son   Social History:   Socioeconomic History:  Marland Kitchen Marital status: Married  Tobacco Use  . Smoking status: Former Smoker  Types: Cigars  . Smokeless tobacco: Never Used  . Tobacco comment: occ cigar for 2-3 years  Substance and Sexual Activity  . Alcohol use: Not Currently  Comment: quit in 1980s  . 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  Physical Exam: Vitals:  10/02/20 1025  BP: 130/76  Weight: (!) 102 kg (224 lb 12.8 oz)  Height: 175.3 cm (5\' 9" )  PainSc: 7  PainLoc: Wrist   General/Constitutional: Pleasant husky elderly male in no acute distress. Neuro/Psych: Normal mood and affect, oriented to person, place and time. Eyes: Non-icteric. Pupils are equal, round, and reactive to light, and exhibit synchronous movement. ENT: Unremarkable. Lymphatic: No palpable adenopathy. Respiratory: Lungs clear to auscultation, Normal chest excursion, No wheezes and Non-labored breathing Cardiovascular: Irregularly irregular rate with no murmurs and No edema, swelling or tenderness, except as noted in detailed exam. Integumentary: No impressive skin lesions present, except as noted in detailed exam. Musculoskeletal: Unremarkable, except as noted in detailed exam.  Left hand exam: Skin inspection of the left hand is unremarkable. No swelling, erythema, ecchymosis, abrasions, or other skin abnormalities are identified. He has mild-moderate focal tenderness to palpation over the base of the left thumb in the area of the [facility] joint, but there are no other areas of tenderness around the wrist or hand. He has a positive grind test. He exhibits full range of motion of the  left wrist without any pain or catching. He is able active flex extend all digits fully without any pain or triggering. He is neurovascularly intact to the left hand.  X-rays/MRI/Lab data:  Recent x-rays of the left hand are available for review and have been reviewed by myself. These films demonstrate advanced degenerative changes involving the left thumb CMC joint with radial subluxation of the thumb metacarpal. X-ray findings include complete loss of the [facility] joint space, osteophyte formation, and subchondral cystic changes. No lytic lesions or other acute bony abnormalities are identified.  Assessment: Primary osteoarthritis of first carpometacarpal joint of left hand.  Plan: The treatment options were discussed with the patient and his wife. In addition, patient educational materials were provided regarding the diagnosis and treatment options. Regarding the patient's left thumb symptoms, he is quite frustrated by the symptoms and functional limitations he is experiencing, and is ready to consider more aggressive treatment options. Therefore, I have recommended a surgical procedure, specifically a suspension arthroplasty of the left thumb CMC joint. The procedure was discussed with the patient, as were the potential risks (including bleeding, infection, nerve and/or blood vessel injury, persistent or recurrent pain, stiffness of the thumb, weakness of grip, need for further surgery, blood clots, strokes, heart attacks and/or arhythmias, pneumonia, etc.) and benefits. The patient states his understanding and wishes  to proceed. All of the patient's questions and concerns were answered.   H&P reviewed and patient re-examined. No changes.

## 2020-10-23 NOTE — Op Note (Signed)
10/23/2020  12:33 PM  Patient:   Ruben Willis  Pre-Op Diagnosis:   Degenerative joint disease of left thumb CMC joint.  Post-Op Diagnosis:   Same.  Procedure:   Suspension arthroplasty left thumb CMC joint.   Surgeon:   Pascal Lux, MD  Assistant:   None  Anesthesia:   General LMA  Findings:   As above.  Complications:   None  EBL:   0 cc  Fluids:   400 cc crystalloid  TT:   75 minutes at 250 mmHg  Drains:   None  Closure:   3-0 Vicryl subcuticular sutures  Implants:   Arthrex 1.1 mm CMC Mini Tightrope.  Brief Clinical Note:   The patient is a 75 year old male with a long history of progressively worsening basilar left thumb pain. The patient's symptoms have progressed despite medications, activity modification, injections, splinting, etc. The patient's history and examination are consistent with advanced degenerative joint disease of the left thumb CMC joint which was confirmed by plain radiographs. The patient presents at this time for a suspension arthroplasty of the left thumb CMC joint.  Procedure:    The patient was brought into the operating room and lain in the supine position. After adequate general laryngeal mask anesthesia was obtained, the patient's left hand and upper extremity were prepped with ChloraPrep solution before being draped sterilely. Preoperative antibiotics were administered. After verifying the appropriate surgical site with a timeout, the limb was exsanguinated with an Esmarch and the tourniquet inflated to 250 mmHg. An approximately 4-5 cm longitudinal incision was made centered over the radial aspect of the thumb CMC joint. The incision was carried down through the subcutaneous cutaneous tissues with care taken to identify and protect the local neurovascular structures. Several small veins were cauterized with bipolar electrocautery. A longitudinal incision was made through the capsular tissues over the dorsal aspect of the thumb CMC joint.  Subperiosteal dissection was carried out to expose the trapezium dorsally and volarly. Once the [facility] and scaphotrapezial joints were identified, a sagittal cut was made in the trapezium using the micro-oscillating saw. This cut extended approximately two-thirds down through the bone. A Hoke osteotome was used to complete transection of the bone. The bone was then removed piecemeal using rongeurs and further dissection. The flexor carpi radialis tendon was identified at the base of the defect. The base of the thumb metacarpal was prepared by exposing its radial base.  The Arthrex 1.1 mm guidewire was drilled up through the proximal end of the thumb metacarpal beginning at the dorsoradial surface and advancing into the volar radial aspect of the proximal index metacarpal shaft. The adequacy of pin position was verified using FluoroScan imaging in AP, lateral, and oblique projections and found to be excellent.  An approximate 1.5 cm incision was made over the dorsal aspect of the proximal end of the index metacarpal. The incision was carried down through the subcutaneous tissues with care taken to avoid damaging the extensor tendon. The interosseous muscle was dissected away from the dorso-ulnar aspect of the proximal index metacarpal shaft before the guidewire was advanced through the final cortex into this wound. The suture was passed through the nitinol wire loop and pulled across the thumb and index metacarpals, seating the mini Endobutton against the proximal end of the thumb metacarpal. The suture was cut and each and passed through the second mini Endobutton. The Endobutton was advanced to rest against the dorsal ulnar aspect of the proximal index metacarpal shaft before it was tied  securely with appropriate tension to stabilize the base of the thumb metacarpal against the base of the index metacarpal. Again, the adequacy of Endobutton position and metacarpal reduction was verified fluoroscopically in AP,  lateral, and oblique projections and found to be excellent. The thumb was then placed through a range of motion. It was able to be abducted and adducted fully and remained distracted to gentle axial loading.  The wounds were copiously irrigated with sterile saline solution using bulb irrigation. The thumb CMC capsular tissues were reapproximated using 2-0 Vicryl interrupted sutures before each wound was closed using 3-0 Vicryl subcuticular interrupted sutures. Benzoin and Steri-Strips were applied to the skin. A total of 10 cc of 0.5% plain Sensorcaine was injected in and around both incisions to help with postoperative analgesia. A sterile bulky dressing was applied to the wounds before the patient was placed into a fiberglass thumb spica splint maintaining the thumb in the position of function. The patient was then awakened, extubated, and returned to the recovery room in satisfactory condition after tolerating the procedure well.

## 2020-10-23 NOTE — Transfer of Care (Signed)
Immediate Anesthesia Transfer of Care Note  Patient: Ruben Willis  Procedure(s) Performed: LEFT THUMB CMC SUSPENSION ARTHROPLASTY (Left Thumb)  Patient Location: PACU  Anesthesia Type:General  Level of Consciousness: drowsy  Airway & Oxygen Therapy: Patient Spontanous Breathing and Patient connected to face mask oxygen  Post-op Assessment: Report given to RN and Post -op Vital signs reviewed and stable  Post vital signs: Reviewed and stable  Last Vitals:  Vitals Value Taken Time  BP 117/72 10/23/20 1233  Temp    Pulse 64 10/23/20 1236  Resp 13 10/23/20 1236  SpO2 96 % 10/23/20 1236  Vitals shown include unvalidated device data.  Last Pain:  Vitals:   10/23/20 0959  TempSrc: Temporal  PainSc: 5       Patients Stated Pain Goal: 0 (07/08/77 8102)  Complications: No complications documented.

## 2020-10-23 NOTE — Anesthesia Procedure Notes (Signed)
Procedure Name: LMA Insertion Date/Time: 10/23/2020 10:42 AM Performed by: Lia Foyer, CRNA Pre-anesthesia Checklist: Patient identified, Emergency Drugs available, Suction available and Patient being monitored Patient Re-evaluated:Patient Re-evaluated prior to induction Oxygen Delivery Method: Circle system utilized Preoxygenation: Pre-oxygenation with 100% oxygen Induction Type: IV induction Ventilation: Mask ventilation without difficulty LMA: LMA inserted LMA Size: 4.0 Tube type: Oral Number of attempts: 1 Placement Confirmation: positive ETCO2 and breath sounds checked- equal and bilateral Tube secured with: Tape Dental Injury: Teeth and Oropharynx as per pre-operative assessment

## 2020-10-23 NOTE — Anesthesia Postprocedure Evaluation (Signed)
Anesthesia Post Note  Patient: Ruben Willis  Procedure(s) Performed: LEFT THUMB CMC SUSPENSION ARTHROPLASTY (Left Thumb)  Patient location during evaluation: PACU Anesthesia Type: General Level of consciousness: awake and alert Pain management: pain level controlled Vital Signs Assessment: post-procedure vital signs reviewed and stable Respiratory status: spontaneous breathing, nonlabored ventilation, respiratory function stable and patient connected to nasal cannula oxygen Cardiovascular status: blood pressure returned to baseline and stable Postop Assessment: no apparent nausea or vomiting Anesthetic complications: no   No complications documented.   Last Vitals:  Vitals:   10/23/20 1315 10/23/20 1343  BP: (!) 153/91 (!) 149/71  Pulse: 65 75  Resp: 12   Temp: (!) 36.2 C   SpO2: 96% 98%    Last Pain:  Vitals:   10/23/20 1343  TempSrc:   PainSc: 0-No pain                 Arita Miss

## 2020-10-23 NOTE — Anesthesia Preprocedure Evaluation (Signed)
Anesthesia Evaluation  Patient identified by MRN, date of birth, ID band Patient awake    Reviewed: Allergy & Precautions, NPO status , Patient's Chart, lab work & pertinent test results  History of Anesthesia Complications Negative for: history of anesthetic complications  Airway Mallampati: II  TM Distance: >3 FB Neck ROM: Full    Dental no notable dental hx. (+) Teeth Intact   Pulmonary neg pulmonary ROS, neg sleep apnea, neg COPD, Patient abstained from smoking.Not current smoker,    Pulmonary exam normal breath sounds clear to auscultation       Cardiovascular Exercise Tolerance: Good METShypertension, (-) CAD and (-) Past MI + dysrhythmias Atrial Fibrillation + pacemaker  Rhythm:Regular Rate:Normal - Systolic murmurs Does not appear to be pacer dependent based on EP notes.    Myocardial perfusion imaging study performed and 04/2017 demonstrated normal left ventricular systolic function (EF 71%) with no evidence of stress-induced myocardial ischemia or arrhythmia.   Patient developed symptomatic bradycardia and heart block.  He underwent PPM placement (St. Jude) on 09/06/2018.   Underwent a diagnostic left heart catheterization on 08/26/2020 that revealed no evidence of CAD.  Procedure revealed very tortuous coronary anatomy.   TTE performed on 08/27/2020 revealed a normal left ventricular systolic function (LVEF >24%) with mild mitral valve regurgitation.   Exercise stress test performed on 08/27/2020 revealed no significant ST or T wave abnormalities.  There was occasional atrial ectopy noted.  Workload of 5.2 METS was achieved without angina/anginal equivalent symptoms     Neuro/Psych negative neurological ROS  negative psych ROS   GI/Hepatic hiatal hernia, neg GERD  ,(+)     (-) substance abuse  ,   Endo/Other  neg diabetes  Renal/GU negative Renal ROS     Musculoskeletal  (+) Arthritis ,    Abdominal   Peds  Hematology   Anesthesia Other Findings Past Medical History: No date: Arthritis No date: Cataract, bilateral No date: Complete heart block (HCC)     Comment:  s/p PPM placement No date: ED (erectile dysfunction) No date: History of BPH     Comment:  s/p TURP No date: History of hiatal hernia     Comment:  surgically repaired No date: History of obstructive sleep apnea     Comment:  resolved following sinus surgery No date: Hypercholesteremia No date: Hyperlipidemia No date: Hypertension No date: Paroxysmal atrial fibrillation (HCC) No date: Pre-diabetes No date: Presence of permanent cardiac pacemaker     Comment:  St.Jude Dual Chamber 06/09/2000: Pulmonary embolism (HCC) No date: Sinus node dysfunction (HCC) No date: Skin cancer     Comment:  removed from nose No date: SOB (shortness of breath)  Reproductive/Obstetrics                             Anesthesia Physical Anesthesia Plan  ASA: III  Anesthesia Plan: General   Post-op Pain Management:    Induction: Intravenous  PONV Risk Score and Plan: 2 and Ondansetron and Dexamethasone  Airway Management Planned: LMA  Additional Equipment: None  Intra-op Plan:   Post-operative Plan: Extubation in OR  Informed Consent: I have reviewed the patients History and Physical, chart, labs and discussed the procedure including the risks, benefits and alternatives for the proposed anesthesia with the patient or authorized representative who has indicated his/her understanding and acceptance.     Dental advisory given  Plan Discussed with: CRNA and Surgeon  Anesthesia Plan Comments: (Discussed risks of anesthesia  with patient, including PONV, sore throat, lip/dental damage. Rare risks discussed as well, such as cardiorespiratory and neurological sequelae. Patient understands.)        Anesthesia Quick Evaluation

## 2020-10-24 ENCOUNTER — Encounter: Payer: Self-pay | Admitting: Surgery

## 2020-11-14 ENCOUNTER — Ambulatory Visit: Payer: Medicare Other | Attending: Physician Assistant | Admitting: Occupational Therapy

## 2020-11-14 DIAGNOSIS — M25632 Stiffness of left wrist, not elsewhere classified: Secondary | ICD-10-CM | POA: Diagnosis present

## 2020-11-14 DIAGNOSIS — M6281 Muscle weakness (generalized): Secondary | ICD-10-CM

## 2020-11-14 DIAGNOSIS — M25642 Stiffness of left hand, not elsewhere classified: Secondary | ICD-10-CM | POA: Diagnosis present

## 2020-11-14 DIAGNOSIS — R600 Localized edema: Secondary | ICD-10-CM

## 2020-11-14 DIAGNOSIS — M79642 Pain in left hand: Secondary | ICD-10-CM

## 2020-11-14 NOTE — Therapy (Signed)
[facility] [address], [place], [address] Phone: [phone]   Fax:  [phone]  Occupational Therapy Evaluation  Patient Details  Name: Ruben Willis MRN: [id number] Date of Birth: Apr 09, 1946 Referring Provider (OT): Dr Roland Rack   Encounter Date: 11/14/2020   OT End of Session - 11/14/20 1904     Visit Number 1    Number of Visits 16    Date for OT Re-Evaluation 01/09/21    OT Start Time 1355    OT Stop Time 1450    OT Time Calculation (min) 55 min    Activity Tolerance Patient tolerated treatment well    Behavior During Therapy [facility] for tasks assessed/performed             Past Medical History:  Diagnosis Date   Arthritis    Cataract, bilateral    Complete heart block (HCC)    s/p PPM placement   ED (erectile dysfunction)    History of BPH    s/p TURP   History of hiatal hernia    surgically repaired   History of obstructive sleep apnea    resolved following sinus surgery   Hypercholesteremia    Hyperlipidemia    Hypertension    Paroxysmal atrial fibrillation ([place])    Pre-diabetes    Presence of permanent cardiac pacemaker    St.Jude Dual Chamber   Pulmonary embolism ([place]) 06/09/2000   Sinus node dysfunction (HCC)    Skin cancer    removed from nose   SOB (shortness of breath)     Past Surgical History:  Procedure Laterality Date   APPENDECTOMY     CARDIAC CATHETERIZATION  08/13/2011   CARPOMETACARPEL SUSPENSION PLASTY Left 10/23/2020   Procedure: LEFT THUMB [facility] SUSPENSION ARTHROPLASTY;  Surgeon: Corky Mull, MD;  Location: [facility] ORS;  Service: Orthopedics;  Laterality: Left;   CATARACT EXTRACTION, BILATERAL     COLONOSCOPY  2014   COSMETIC SURGERY  05/26/2016   eye lids   HAND SURGERY     HERNIA REPAIR     HIATAL   KNEE SURGERY     NASAL SEPTUM SURGERY     OSTEOTOMY AND ULNAR SHORTENING     PACEMAKER PLACEMENT  08/2018   Dr. Trinna Balloon Bumgarner   PALATE SURGERY      PROSTATE SURGERY     SKIN CANCER EXCISION     SKIN GRAFT     VASECTOMY      There were no vitals filed for this visit.   Subjective Assessment - 11/14/20 1551     Subjective  I had thumb surgery and it hurts when I take if off and it is swollen - they just told me to keep it up - only taking it of in shower - numbness in my R pinkie and ring finger    Pertinent History Ruben Willis is a 75 y.o. who presents today status post [name] joint left thumb on 10/24/20.  He has had no fevers or chills. Pain under control. Initially denied any numbness tingling or burning sensation but after removal of the t health he had numbness along the ulnar aspect of the thumb and radial aspect of the index finger. 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 Yes    Pain Score 8     Pain Location Hand    Pain Orientation Left    Pain Descriptors / Indicators Aching;Tightness;Tender;Numbness;Sharp    Pain Type Acute pain;Surgical pain    Pain Onset More than a month ago    Pain Frequency Intermittent    Aggravating Factors  motion of thumb               [facility] OT Assessment - 11/14/20 0001       Assessment   Medical Diagnosis L CMC arthroplasty    Referring Provider (OT) Dr Roland Rack    Onset Date/Surgical Date 10/23/20    Hand Dominance Right      Precautions   Required Braces or Orthoses --   prefab thumb spica     Home  Environment   Lives With Spouse      Prior Function   Vocation Retired    Leisure carpentery, Producer, television/film/video, Haematologist, golf and fishing      AROM   Left Wrist Extension 30 Degrees    Left Wrist Flexion 36 Degrees    Left Wrist Radial Deviation 12 Degrees    Left Wrist Ulnar Deviation 25 Degrees      Left Hand AROM   L Thumb MCP 0-60 40 Degrees    L Thumb IP 0-80 40 Degrees    L Thumb Radial  ADduction/ABduction 0-55 60    L Thumb Palmar ADduction/ABduction 0-45 48    L Thumb Opposition to Index --   opposition to 2nd - pain increaes 8/10   L Index  MCP 0-90 70 Degrees    L Index PIP 0-100 90 Degrees    L Long  MCP 0-90 80 Degrees    L Long PIP 0-100 100 Degrees    L Ring  MCP 0-90 80 Degrees    L Ring PIP 0-100 100 Degrees    L Little  MCP 0-90 90 Degrees    L Little PIP 0-100 90 Degrees                      OT Treatments/Exercises (OP) - 11/14/20 0001       LUE Contrast Bath   Time 8 minutes    Comments prior to review of HEP - decrease pain, increase ROM            Review HEP with pt - hand out provided  Contrast 2-3 x day  Light scar massage and cica scar pad night time  Wear isotoner glove as needed -  Thumb spica most all the time  Keep pain under 2/10 Tendon glides - 10 reps  Thumb PA and RA  Opposition pick up 2 cm foam bloc - thumb to 2nd , 3rd and 4th digits only - 5 reps  Wrist AROM flexion and extention   RD, UD - AAROM 10 reps         OT Education - 11/14/20 1904     Education Details findings of eval and HEP    Person(s) Educated Patient    Methods Explanation;Demonstration;Tactile cues;Verbal cues    Comprehension Verbal cues required;Returned demonstration;Verbalized understanding              OT Short Term Goals - 11/14/20 1912       OT SHORT TERM GOAL #1   Title Pt to be independent in HEP to decrease edema and pain to initiated AROM of L thumb and wrist with pain  less than 3/10    Baseline pain with AROM of thumb and wrist increase 8/10 , edema of 1 cm for MCs mostly in 2nd and 3rd - and thumb    Time 3    Status New    Target Date 12/05/20               OT Long Term Goals - 11/14/20 1913       OT LONG TERM GOAL #1   Title L thumb AROM improve to [facility] to hold cup,turn doorknob and do buttons with symptoms less than 2/10    Baseline L thumb IP , MC 40 each , PA and RA 48 and 60 - pain 8/10    Time  5    Status New    Target Date 12/19/20      OT LONG TERM GOAL #2   Title L wrist AROM /strength increase to [facility] to push up from chair and use in bathing and dressing without symptoms    Baseline thumb spica most all the time - pain 8/10 out of splint with AROM - decrease ROM in all planes for wrist and thumb    Time 8    Period Weeks    Status New    Target Date 01/09/21      OT LONG TERM GOAL #3   Title L grip and prehension strength increase to more than 60% compare to R hand to pull, push door, hold hammer    Baseline 3 wks s/p  - strength NT    Time 8    Period Weeks    Status New    Target Date 01/09/21                   Plan - 11/14/20 1907     Clinical Impression Statement Pt present at OT eval 3 wks s/p L thumb CMC arthroplasty - pt in prefab thumb spica - show increase edema over hand , pain increase to 8-9/10 pain with AROM of thumb and wrist , scar tissue , decrease ROM and strength limiting his functional use of L hand and wrist in ADL's and IADL's - pt can benefit from OT services    OT Occupational Profile and History Problem Focused Assessment - Including review of records relating to presenting problem    Occupational performance deficits (Please refer to evaluation for details): ADL's;IADL's;Work;Play;Leisure;Social Participation    Body Structure / Function / Physical Skills ADL;FMC;Flexibility;Edema;Dexterity;Decreased knowledge of use of DME;Pain;UE functional use;IADL    Rehab Potential Good    Clinical Decision Making Limited treatment options, no task modification necessary    Modification or Assistance to Complete Evaluation  No modification of tasks or assist necessary to complete eval    OT Frequency 2x / week    OT Duration 8 weeks    OT Treatment/Interventions Self-care/ADL training;Fluidtherapy;DME and/or AE instruction;Splinting;Contrast Bath;Paraffin;Manual Therapy;Passive range of motion;Therapeutic exercise;Scar mobilization    Consulted  and Agree with Plan of Care Patient             Patient will benefit from skilled therapeutic intervention in order to improve the following deficits and impairments:   Body Structure / Function / Physical Skills: ADL, FMC, Flexibility, Edema, Dexterity, Decreased knowledge of use of DME, Pain, UE functional use, IADL       Visit Diagnosis: Pain in left hand - Plan: Ot plan of care cert/re-cert  Stiffness of left hand, not elsewhere classified - Plan: Ot plan of care cert/re-cert  Stiffness of left wrist, not elsewhere classified - Plan: Ot plan of care cert/re-cert  Localized edema - Plan: Ot plan of care cert/re-cert  Muscle weakness (generalized) - Plan: Ot plan of care cert/re-cert    Problem List There are no problems to display for this patient.   Rosalyn Gess OTR/L,CLT 11/14/2020, 7:20 PM  [place] [facility] [address], [place], [address] Phone: [phone]   Fax:  [phone]  Name: Ruben Willis MRN: [id number] Date of Birth: 07-29-1945

## 2020-11-20 ENCOUNTER — Ambulatory Visit: Payer: Medicare Other | Attending: Physician Assistant | Admitting: Occupational Therapy

## 2020-11-20 DIAGNOSIS — M6281 Muscle weakness (generalized): Secondary | ICD-10-CM | POA: Diagnosis present

## 2020-11-20 DIAGNOSIS — M25642 Stiffness of left hand, not elsewhere classified: Secondary | ICD-10-CM

## 2020-11-20 DIAGNOSIS — R600 Localized edema: Secondary | ICD-10-CM | POA: Diagnosis present

## 2020-11-20 DIAGNOSIS — M25632 Stiffness of left wrist, not elsewhere classified: Secondary | ICD-10-CM | POA: Insufficient documentation

## 2020-11-20 DIAGNOSIS — M79642 Pain in left hand: Secondary | ICD-10-CM

## 2020-11-20 NOTE — Therapy (Signed)
[facility] [address], [place], [address] Phone: [phone]   Fax:  [phone]  Occupational Therapy Treatment  Patient Details  Name: Ruben Willis MRN: [id number] Date of Birth: [birth year] Referring Provider (OT): Dr Roland Rack   Encounter Date: 11/20/2020   OT End of Session - 11/20/20 1740     Visit Number 2    Number of Visits 16    Date for OT Re-Evaluation 01/09/21    OT Start Time 1651    OT Stop Time 1739    OT Time Calculation (min) 48 min    Activity Tolerance Patient tolerated treatment well    Behavior During Therapy [facility] for tasks assessed/performed             Past Medical History:  Diagnosis Date   Arthritis    Cataract, bilateral    Complete heart block (HCC)    s/p PPM placement   ED (erectile dysfunction)    History of BPH    s/p TURP   History of hiatal hernia    surgically repaired   History of obstructive sleep apnea    resolved following sinus surgery   Hypercholesteremia    Hyperlipidemia    Hypertension    Paroxysmal atrial fibrillation ([place])    Pre-diabetes    Presence of permanent cardiac pacemaker    St.Jude Dual Chamber   Pulmonary embolism ([place]) 06/09/2000   Sinus node dysfunction (HCC)    Skin cancer    removed from nose   SOB (shortness of breath)     Past Surgical History:  Procedure Laterality Date   APPENDECTOMY     CARDIAC CATHETERIZATION  08/13/2011   CARPOMETACARPEL SUSPENSION PLASTY Left 10/23/2020   Procedure: LEFT THUMB [place] SUSPENSION ARTHROPLASTY;  Surgeon: Corky Mull, MD;  Location: [facility] ORS;  Service: Orthopedics;  Laterality: Left;   CATARACT EXTRACTION, BILATERAL     COLONOSCOPY  2014   COSMETIC SURGERY  05/26/2016   eye lids   HAND SURGERY     HERNIA REPAIR     HIATAL   KNEE SURGERY     NASAL SEPTUM SURGERY     OSTEOTOMY AND ULNAR SHORTENING     PACEMAKER PLACEMENT  08/2018   Dr. Trinna Balloon Bumgarner   PALATE SURGERY      PROSTATE SURGERY     SKIN CANCER EXCISION     SKIN GRAFT     VASECTOMY      There were no vitals filed for this visit.   Subjective Assessment - 11/20/20 1739     Subjective  Did okay- pain better- and swelling- weairng my glove and splint- leave it off for about hour after my exercises 3  x day    Pertinent History Ruben Willis is a 75 y.o. who presents today status post [facility] joint left thumb on 10/24/20.  He has had no fevers or chills. Pain under control. Initially denied any numbness tingling or burning sensation but after removal of the t health he had numbness along the ulnar aspect of the thumb and radial aspect of the index finger. 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 Yes    Pain Score 5     Pain Location Hand  Pain Orientation Left    Pain Descriptors / Indicators Aching;Tender;Tightness    Pain Type Surgical pain    Pain Onset More than a month ago                [facility] OT Assessment - 11/20/20 0001       AROM   Left Wrist Extension 65 Degrees    Left Wrist Flexion 62 Degrees    Left Wrist Radial Deviation 22 Degrees    Left Wrist Ulnar Deviation 25 Degrees      Left Hand AROM   L Thumb MCP 0-60 40 Degrees    L Thumb IP 0-80 40 Degrees    L Thumb Radial ADduction/ABduction 0-55 60    L Thumb Palmar ADduction/ABduction 0-45 55    L Index  MCP 0-90 75 Degrees    L Index PIP 0-100 90 Degrees    L Long  MCP 0-90 80 Degrees    L Long PIP 0-100 95 Degrees    L Ring  MCP 0-90 80 Degrees    L Ring PIP 0-100 100 Degrees    L Little  MCP 0-90 90 Degrees    L Little PIP 0-100 95 Degrees               measurements taken - see flowsheet  Good progress        OT Treatments/Exercises (OP) - 11/20/20 0001       LUE Fluidotherapy   Number Minutes Fluidotherapy 10  Minutes    LUE Fluidotherapy Location Hand;Wrist    Comments AROM for wrist and thumb in all planes - ice inbetween 2 x 1 in            Pt cont to have edema over radial side of hand - can use his voltaren ointment night time And cont to use isotoner glove under thumb spica  Cont at home with Contrast 2-3 x day  Light scar massage and cica scar pad night time - new one provided   Thumb spica most all the time - off for hour or 2 after HEP   Keep pain under 2/10 Tendon glides - 10 reps focus on extention  Thumb PA and RA- but do not strain Opposition pick up 2 cm foam block  with all digits  5 reps and then AROM to thumb tbut only to 2nd , 3rd and 4th digits but open thumb every time- 5 reps Wrist AAROM flexion and extention and   RD, UD - AAROM 10 reps          OT Education - 11/20/20 1740     Education Details progress and HEP    Person(s) Educated Patient    Methods Explanation;Demonstration;Tactile cues;Verbal cues    Comprehension Verbal cues required;Returned demonstration;Verbalized understanding              OT Short Term Goals - 11/14/20 1912       OT SHORT TERM GOAL #1   Title Pt to be independent in HEP to decrease edema and pain to initiated AROM of L thumb and wrist with pain less than 3/10    Baseline pain with AROM of thumb and wrist increase 8/10 , edema of 1 cm for MCs mostly in 2nd and 3rd - and thumb    Time 3    Status New    Target Date 12/05/20               OT Long Term Goals -  11/14/20 1913       OT LONG TERM GOAL #1   Title L thumb AROM improve to [facility] to hold cup,turn doorknob and do buttons with symptoms less than 2/10    Baseline L thumb IP , MC 40 each , PA and RA 48 and 60 - pain 8/10    Time 5    Status New    Target Date 12/19/20      OT LONG TERM GOAL #2   Title L wrist AROM /strength increase to [facility] to push up from chair and use in bathing and dressing without symptoms    Baseline thumb spica most all the time -  pain 8/10 out of splint with AROM - decrease ROM in all planes for wrist and thumb    Time 8    Period Weeks    Status New    Target Date 01/09/21      OT LONG TERM GOAL #3   Title L grip and prehension strength increase to more than 60% compare to R hand to pull, push door, hold hammer    Baseline 3 wks s/p  - strength NT    Time 8    Period Weeks    Status New    Target Date 01/09/21                   Plan - 11/20/20 1741     Clinical Impression Statement Pt present 4  wks s/p L thumb CMC arthroplasty - pt in prefab thumb spica -cont to show increase edema over hand on radial side - and 2nd and 3rd MC's - pain still increase to 4-6/10 but pt to keep HEP under 2/10 -and use his voltaren ointment over 2nd and 3rd MC and webspace- cont to show decrease  AROM of thumb and wrist , scar tissue  limiting his functional use of L hand and wrist in ADL's and IADL's - pt show increase AROM in thumb and wrist - but reminder to keep pain under 2/10  pt can benefit from OT services    OT Occupational Profile and History Problem Focused Assessment - Including review of records relating to presenting problem    Occupational performance deficits (Please refer to evaluation for details): ADL's;IADL's;Work;Play;Leisure;Social Participation    Body Structure / Function / Physical Skills ADL;FMC;Flexibility;Edema;Dexterity;Decreased knowledge of use of DME;Pain;UE functional use;IADL    Rehab Potential Good    Clinical Decision Making Limited treatment options, no task modification necessary    Comorbidities Affecting Occupational Performance: None    Modification or Assistance to Complete Evaluation  No modification of tasks or assist necessary to complete eval    OT Frequency 2x / week    OT Duration 8 weeks    OT Treatment/Interventions Self-care/ADL training;Fluidtherapy;DME and/or AE instruction;Splinting;Contrast Bath;Paraffin;Manual Therapy;Passive range of motion;Therapeutic exercise;Scar  mobilization    Consulted and Agree with Plan of Care Patient             Patient will benefit from skilled therapeutic intervention in order to improve the following deficits and impairments:   Body Structure / Function / Physical Skills: ADL, FMC, Flexibility, Edema, Dexterity, Decreased knowledge of use of DME, Pain, UE functional use, IADL       Visit Diagnosis: Pain in left hand  Stiffness of left hand, not elsewhere classified  Stiffness of left wrist, not elsewhere classified  Localized edema  Muscle weakness (generalized)    Problem List There are no problems to display for this  patient.   Rosalyn Gess OTR/L,CLT 11/20/2020, 5:49 PM  [place] [address], [place], [address] Phone: [phone]   Fax:  [phone]  Name: Absalom Aro MRN: [id number] Date of Birth: 01-01-1946

## 2020-11-21 ENCOUNTER — Ambulatory Visit: Payer: Medicare Other | Admitting: Occupational Therapy

## 2020-11-25 ENCOUNTER — Ambulatory Visit: Payer: Medicare Other | Admitting: Occupational Therapy

## 2020-11-25 DIAGNOSIS — M79642 Pain in left hand: Secondary | ICD-10-CM

## 2020-11-25 DIAGNOSIS — M25632 Stiffness of left wrist, not elsewhere classified: Secondary | ICD-10-CM

## 2020-11-25 DIAGNOSIS — M25642 Stiffness of left hand, not elsewhere classified: Secondary | ICD-10-CM

## 2020-11-25 DIAGNOSIS — M6281 Muscle weakness (generalized): Secondary | ICD-10-CM

## 2020-11-25 DIAGNOSIS — R600 Localized edema: Secondary | ICD-10-CM

## 2020-11-25 NOTE — Therapy (Signed)
[facility] [address], [place], [address] Phone: [phone]   Fax:  [phone]  Occupational Therapy Treatment  Patient Details  Name: Ruben Willis MRN: [id number] Date of Birth: February 19, 1946 Referring Provider (OT): Dr Roland Rack   Encounter Date: 11/25/2020   OT End of Session - 11/25/20 1807     Visit Number 3    Number of Visits 16    Date for OT Re-Evaluation 01/09/21    OT Start Time 8850    OT Stop Time 1520    OT Time Calculation (min) 46 min    Activity Tolerance Patient tolerated treatment well    Behavior During Therapy [facility] for tasks assessed/performed             Past Medical History:  Diagnosis Date   Arthritis    Cataract, bilateral    Complete heart block (HCC)    s/p PPM placement   ED (erectile dysfunction)    History of BPH    s/p TURP   History of hiatal hernia    surgically repaired   History of obstructive sleep apnea    resolved following sinus surgery   Hypercholesteremia    Hyperlipidemia    Hypertension    Paroxysmal atrial fibrillation ([place])    Pre-diabetes    Presence of permanent cardiac pacemaker    St.Jude Dual Chamber   Pulmonary embolism ([place]) 06/09/2000   Sinus node dysfunction (HCC)    Skin cancer    removed from nose   SOB (shortness of breath)     Past Surgical History:  Procedure Laterality Date   APPENDECTOMY     CARDIAC CATHETERIZATION  08/13/2011   CARPOMETACARPEL SUSPENSION PLASTY Left 10/23/2020   Procedure: LEFT THUMB [place] SUSPENSION ARTHROPLASTY;  Surgeon: Corky Mull, MD;  Location: [facility] ORS;  Service: Orthopedics;  Laterality: Left;   CATARACT EXTRACTION, BILATERAL     COLONOSCOPY  2014   COSMETIC SURGERY  05/26/2016   eye lids   HAND SURGERY     HERNIA REPAIR     HIATAL   KNEE SURGERY     NASAL SEPTUM SURGERY     OSTEOTOMY AND ULNAR SHORTENING     PACEMAKER PLACEMENT  08/2018   Dr. Trinna Balloon Bumgarner   PALATE SURGERY      PROSTATE SURGERY     SKIN CANCER EXCISION     SKIN GRAFT     VASECTOMY      There were no vitals filed for this visit.   Subjective Assessment - 11/25/20 1805     Subjective  My hands or thumb has less pain when doing my exercises if I loose splint off - like this am - but when I take splint off and do - thumb wants to hurt    Pertinent History Ruben Willis is a 75 y.o. who presents today status post [facility] joint left thumb on 10/24/20.  He has had no fevers or chills. Pain under control. Initially denied any numbness tingling or burning sensation but after removal of the t health he had numbness along the ulnar aspect of the thumb and radial aspect of the index finger. 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 Yes    Pain  Score 3     Pain Location Hand    Pain Orientation Left    Pain Descriptors / Indicators Aching;Tender;Tightness    Pain Type Surgical pain    Pain Onset More than a month ago    Pain Frequency Intermittent    Aggravating Factors  Opposition of thumb to 4th and 5th                           OT Treatments/Exercises (OP) - 11/25/20 0001       LUE Fluidotherapy   Number Minutes Fluidotherapy 8 Minutes    LUE Fluidotherapy Location Hand;Wrist    Comments AROM for thumb in all planes            Pt show coming in decrease edema - pt to cont with contrast and his voltaren ointment night time  Did increase edema after fluido   And cont to use isotoner glove  and then thumb spica when out and about and night time   Cont at home with Contrast 2-3 x day  Light scar massage and cica scar pad night time - new one provided -doing great        Keep pain under 2/10 Tendon glides - 10 reps focus on extention  Thumb PA and RA- but do not strain Thumb circular for warm  up   Opposition pick up 2 cm foam block  with all digits  5 reps pain free and then  AROM to thumb but only to 2nd , 3rd  pain free- if pain free can go to 4th and 5th - open thumb every time  Wrist AAROM flexion and extention and   RD, UD - AAROM 10 reps Add this date composite thumb PA to RA - place and hold 10 reps  3 x day  And isometric for thumb at PA - 45 degrees- in all planes - 15 -20 sec x 4 - 4 x day  Pain free           OT Education - 11/25/20 1807     Education Details progress and HEP    Person(s) Educated Patient    Methods Explanation;Demonstration;Tactile cues;Verbal cues    Comprehension Verbal cues required;Returned demonstration;Verbalized understanding              OT Short Term Goals - 11/14/20 1912       OT SHORT TERM GOAL #1   Title Pt to be independent in HEP to decrease edema and pain to initiated AROM of L thumb and wrist with pain less than 3/10    Baseline pain with AROM of thumb and wrist increase 8/10 , edema of 1 cm for MCs mostly in 2nd and 3rd - and thumb    Time 3    Status New    Target Date 12/05/20               OT Long Term Goals - 11/14/20 1913       OT LONG TERM GOAL #1   Title L thumb AROM improve to [facility] to hold cup,turn doorknob and do buttons with symptoms less than 2/10    Baseline L thumb IP , MC 40 each , PA and RA 48 and 60 - pain 8/10    Time 5    Status New    Target Date 12/19/20      OT LONG TERM GOAL #2   Title L wrist AROM Terrence Dupont increase  to [facility] to push up from chair and use in bathing and dressing without symptoms    Baseline thumb spica most all the time - pain 8/10 out of splint with AROM - decrease ROM in all planes for wrist and thumb    Time 8    Period Weeks    Status New    Target Date 01/09/21      OT LONG TERM GOAL #3   Title L grip and prehension strength increase to more than 60% compare to R hand to pull, push door, hold hammer    Baseline 3 wks s/p  - strength NT    Time 8     Period Weeks    Status New    Target Date 01/09/21                   Plan - 11/25/20 1807     Clinical Impression Statement Pt present 75 1/2   wks s/p L thumb CMC arthroplasty - pt in prefab thumb spica -Pt edema coming in today better with doing contrast and using some Voltaren - but edema did increase after fluido - pt to cont with constrast - can take off splint in the house but when out of house - wear splint - AROM doing well - pain only with opposition to 4th and 5th -and initiate this date isometric strengthening to thumb and composite PA to RA place and hold- Reinforce to keep pain under 2/10 -  pt can benefit from OT services    OT Occupational Profile and History Problem Focused Assessment - Including review of records relating to presenting problem    Occupational performance deficits (Please refer to evaluation for details): ADL's;IADL's;Work;Play;Leisure;Social Participation    Body Structure / Function / Physical Skills ADL;FMC;Flexibility;Edema;Dexterity;Decreased knowledge of use of DME;Pain;UE functional use;IADL    Rehab Potential Good    Clinical Decision Making Limited treatment options, no task modification necessary    Comorbidities Affecting Occupational Performance: None    Modification or Assistance to Complete Evaluation  No modification of tasks or assist necessary to complete eval    OT Frequency 2x / week    OT Duration 8 weeks    OT Treatment/Interventions Self-care/ADL training;Fluidtherapy;DME and/or AE instruction;Splinting;Contrast Bath;Paraffin;Manual Therapy;Passive range of motion;Therapeutic exercise;Scar mobilization    Consulted and Agree with Plan of Care Patient             Patient will benefit from skilled therapeutic intervention in order to improve the following deficits and impairments:   Body Structure / Function / Physical Skills: ADL, FMC, Flexibility, Edema, Dexterity, Decreased knowledge of use of DME, Pain, UE functional use,  IADL       Visit Diagnosis: Pain in left hand  Stiffness of left hand, not elsewhere classified  Stiffness of left wrist, not elsewhere classified  Localized edema  Muscle weakness (generalized)    Problem List There are no problems to display for this patient.   Rosalyn Gess OTR/L,CLT 11/25/2020, 6:11 PM  [facility] [address], [place], [address] Phone: [phone]   Fax:  [phone]  Name: Jomar Denz MRN: [id number] Date of Birth: 05-19-1945

## 2020-11-28 ENCOUNTER — Ambulatory Visit: Payer: Medicare Other | Admitting: Occupational Therapy

## 2020-11-28 DIAGNOSIS — M25642 Stiffness of left hand, not elsewhere classified: Secondary | ICD-10-CM

## 2020-11-28 DIAGNOSIS — M25632 Stiffness of left wrist, not elsewhere classified: Secondary | ICD-10-CM

## 2020-11-28 DIAGNOSIS — M6281 Muscle weakness (generalized): Secondary | ICD-10-CM

## 2020-11-28 DIAGNOSIS — M79642 Pain in left hand: Secondary | ICD-10-CM | POA: Diagnosis not present

## 2020-11-28 DIAGNOSIS — R600 Localized edema: Secondary | ICD-10-CM

## 2020-11-28 NOTE — Therapy (Signed)
[facility] [address], [place], [address] Phone: [phone]   Fax:  [phone]  Occupational Therapy Treatment  Patient Details  Name: Ruben Willis MRN: [id number] Date of Birth: [birth year] Referring Provider (OT): Dr Roland Rack   Encounter Date: 11/28/2020   OT End of Session - 11/28/20 1710     Visit Number 4    Number of Visits 16    Date for OT Re-Evaluation 01/09/21    OT Start Time 1232    OT Stop Time 1321    OT Time Calculation (min) 49 min    Activity Tolerance Patient tolerated treatment well    Behavior During Therapy [facility] for tasks assessed/performed             Past Medical History:  Diagnosis Date   Arthritis    Cataract, bilateral    Complete heart block (HCC)    s/p PPM placement   ED (erectile dysfunction)    History of BPH    s/p TURP   History of hiatal hernia    surgically repaired   History of obstructive sleep apnea    resolved following sinus surgery   Hypercholesteremia    Hyperlipidemia    Hypertension    Paroxysmal atrial fibrillation ([place])    Pre-diabetes    Presence of permanent cardiac pacemaker    St.Jude Dual Chamber   Pulmonary embolism ([place]) 06/09/2000   Sinus node dysfunction (HCC)    Skin cancer    removed from nose   SOB (shortness of breath)     Past Surgical History:  Procedure Laterality Date   APPENDECTOMY     CARDIAC CATHETERIZATION  08/13/2011   CARPOMETACARPEL SUSPENSION PLASTY Left 10/23/2020   Procedure: LEFT THUMB [place] SUSPENSION ARTHROPLASTY;  Surgeon: Corky Mull, MD;  Location: [facility] ORS;  Service: Orthopedics;  Laterality: Left;   CATARACT EXTRACTION, BILATERAL     COLONOSCOPY  2014   COSMETIC SURGERY  05/26/2016   eye lids   HAND SURGERY     HERNIA REPAIR     HIATAL   KNEE SURGERY     NASAL SEPTUM SURGERY     OSTEOTOMY AND ULNAR SHORTENING     PACEMAKER PLACEMENT  08/2018   Dr. Trinna Balloon Bumgarner   PALATE SURGERY      PROSTATE SURGERY     SKIN CANCER EXCISION     SKIN GRAFT     VASECTOMY      There were no vitals filed for this visit.   Subjective Assessment - 11/28/20 1247     Subjective  I was doing good -but then this morning more pain after I have done the exercises -were good yesterday and keeping it more off when in the house    Pertinent History Ruben Willis is a 75 y.o. who presents today status post [facility] joint left thumb on 10/24/20.  He has had no fevers or chills. Pain under control. Initially denied any numbness tingling or burning sensation but after removal of the t health he had numbness along the ulnar aspect of the thumb and radial aspect of the index finger. 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 Yes    Pain Score 5  Pain Location Hand    Pain Orientation Left    Pain Descriptors / Indicators Aching;Tightness;Tender    Pain Type Surgical pain    Pain Onset More than a month ago    Pain Frequency Intermittent               Pt cont to have some pain with composite PA from RA - place and hold  As well as pain with isometric and opposition to 5th more than 4th  Edema cont            OT Treatments/Exercises (OP) - 11/28/20 0001       LUE Fluidotherapy   Number Minutes Fluidotherapy 11 Minutes    LUE Fluidotherapy Location Hand;Wrist    Comments AROM for thumb and writ - done ice inbetween            Pt cont with contrast and his voltaren ointment night time for edema- can try epson salt     And cont to use isotoner glove  and then thumb spica when out and about and night time    Cont at home with Contrast 2-3 x day  Light scar massage and cica scar pad night time - new one provided -doing great        Keep pain under 2/10 Tendon glides - 10 reps focus on  extention  Thumb PA and RA- but do not strain Thumb circular for warm up   PROM for thumb IP and MC with CMC block prior to opposition  Opposition pick up 2 cm foam block  with all digits  5 reps pain free and then  AROM to thumb but only to 2nd , 3rd ,4th pain free - if pain free can go to 5th - open thumb every time  Wrist AAROM flexion and extention ,  RD, UD - AAROM 10 reps over edge of table - pain free  Review agai composite thumb PA to RA - place and hold 10 reps- GENTLE - was pain free this date   3 x day And isometric for thumb at PA /RA - 45 degrees- in all planes - 15 -20 sec x 4 - 4 x day Pain free - gentle and lightly - proximal to IP of thumb             OT Education - 11/28/20 1710     Education Details progress and HEP    Person(s) Educated Patient    Methods Explanation;Demonstration;Tactile cues;Verbal cues    Comprehension Verbal cues required;Returned demonstration;Verbalized understanding              OT Short Term Goals - 11/14/20 1912       OT SHORT TERM GOAL #1   Title Pt to be independent in HEP to decrease edema and pain to initiated AROM of L thumb and wrist with pain less than 3/10    Baseline pain with AROM of thumb and wrist increase 8/10 , edema of 1 cm for MCs mostly in 2nd and 3rd - and thumb    Time 3    Status New    Target Date 12/05/20               OT Long Term Goals - 11/14/20 1913       OT LONG TERM GOAL #1   Title L thumb AROM improve to [facility] to hold cup,turn doorknob and do buttons with symptoms less than 2/10    Baseline L thumb IP ,  MC 40 each , PA and RA 48 and 60 - pain 8/10    Time 5    Status New    Target Date 12/19/20      OT LONG TERM GOAL #2   Title L wrist AROM /strength increase to [facility] to push up from chair and use in bathing and dressing without symptoms    Baseline thumb spica most all the time - pain 8/10 out of splint with AROM - decrease ROM in all planes for wrist and thumb    Time 8    Period  Weeks    Status New    Target Date 01/09/21      OT LONG TERM GOAL #3   Title L grip and prehension strength increase to more than 60% compare to R hand to pull, push door, hold hammer    Baseline 3 wks s/p  - strength NT    Time 8    Period Weeks    Status New    Target Date 01/09/21                   Plan - 11/28/20 1711     Clinical Impression Statement Pt present 5 wks s/p L thumb CMC arthroplasty - pt in prefab thumb spica -Pt edema cont to be present over radial hand and thenar eminence - done contrast with fluido this date and AROM for thumb  - can take off splint in the house but when out of house - wear splint - AROM doing well - pain only with opposition to 4th and 5th but improving  -and review again  isometric strengthening to thumb and composite PA to RA place and hold-  pt to do gentle and slow - but doing to forcefull - Reinforce to keep pain under 2/10 -  pt can benefit from OT services    OT Occupational Profile and History Problem Focused Assessment - Including review of records relating to presenting problem    Occupational performance deficits (Please refer to evaluation for details): ADL's;IADL's;Work;Play;Leisure;Social Participation    Body Structure / Function / Physical Skills ADL;FMC;Flexibility;Edema;Dexterity;Decreased knowledge of use of DME;Pain;UE functional use;IADL    Rehab Potential Good    Clinical Decision Making Limited treatment options, no task modification necessary    Comorbidities Affecting Occupational Performance: None    Modification or Assistance to Complete Evaluation  No modification of tasks or assist necessary to complete eval    OT Frequency 2x / week    OT Duration 8 weeks    OT Treatment/Interventions Self-care/ADL training;Fluidtherapy;DME and/or AE instruction;Splinting;Contrast Bath;Paraffin;Manual Therapy;Passive range of motion;Therapeutic exercise;Scar mobilization    Consulted and Agree with Plan of Care Patient              Patient will benefit from skilled therapeutic intervention in order to improve the following deficits and impairments:   Body Structure / Function / Physical Skills: ADL, FMC, Flexibility, Edema, Dexterity, Decreased knowledge of use of DME, Pain, UE functional use, IADL       Visit Diagnosis: Pain in left hand  Stiffness of left hand, not elsewhere classified  Stiffness of left wrist, not elsewhere classified  Localized edema  Muscle weakness (generalized)    Problem List There are no problems to display for this patient.   Rosalyn Gess OTR/L,CLT 11/28/2020, 5:15 PM  [facility] [address], [place], [address] Phone: [phone]   Fax:  [phone]  Name:  Ruben Willis MRN: [id number] Date of Birth: [birth year]

## 2020-12-02 ENCOUNTER — Ambulatory Visit: Payer: Medicare Other | Admitting: Occupational Therapy

## 2020-12-02 DIAGNOSIS — M25642 Stiffness of left hand, not elsewhere classified: Secondary | ICD-10-CM

## 2020-12-02 DIAGNOSIS — M79642 Pain in left hand: Secondary | ICD-10-CM

## 2020-12-02 DIAGNOSIS — M6281 Muscle weakness (generalized): Secondary | ICD-10-CM

## 2020-12-02 DIAGNOSIS — M25632 Stiffness of left wrist, not elsewhere classified: Secondary | ICD-10-CM

## 2020-12-02 DIAGNOSIS — R600 Localized edema: Secondary | ICD-10-CM

## 2020-12-02 NOTE — Therapy (Signed)
[facility] [address], [place], [address] Phone: [phone]   Fax:  [phone]  Occupational Therapy Treatment  Patient Details  Name: Ruben Willis MRN: [id number] Date of Birth: 1945-06-12 Referring Provider (OT): Dr Roland Rack   Encounter Date: 12/02/2020   OT End of Session - 12/02/20 1749     Visit Number 5    Number of Visits 16    Date for OT Re-Evaluation 01/09/21    OT Start Time 4431    OT Stop Time 1431    OT Time Calculation (min) 43 min    Activity Tolerance Patient tolerated treatment well    Behavior During Therapy [facility] for tasks assessed/performed             Past Medical History:  Diagnosis Date   Arthritis    Cataract, bilateral    Complete heart block (HCC)    s/p PPM placement   ED (erectile dysfunction)    History of BPH    s/p TURP   History of hiatal hernia    surgically repaired   History of obstructive sleep apnea    resolved following sinus surgery   Hypercholesteremia    Hyperlipidemia    Hypertension    Paroxysmal atrial fibrillation ([place])    Pre-diabetes    Presence of permanent cardiac pacemaker    St.Jude Dual Chamber   Pulmonary embolism ([place]) 06/09/2000   Sinus node dysfunction (HCC)    Skin cancer    removed from nose   SOB (shortness of breath)     Past Surgical History:  Procedure Laterality Date   APPENDECTOMY     CARDIAC CATHETERIZATION  08/13/2011   CARPOMETACARPEL SUSPENSION PLASTY Left 10/23/2020   Procedure: LEFT THUMB [place] SUSPENSION ARTHROPLASTY;  Surgeon: Corky Mull, MD;  Location: [facility] ORS;  Service: Orthopedics;  Laterality: Left;   CATARACT EXTRACTION, BILATERAL     COLONOSCOPY  2014   COSMETIC SURGERY  05/26/2016   eye lids   HAND SURGERY     HERNIA REPAIR     HIATAL   KNEE SURGERY     NASAL SEPTUM SURGERY     OSTEOTOMY AND ULNAR SHORTENING     PACEMAKER PLACEMENT  08/2018   Dr. Trinna Balloon Bumgarner   PALATE SURGERY      PROSTATE SURGERY     SKIN CANCER EXCISION     SKIN GRAFT     VASECTOMY      There were no vitals filed for this visit.   Subjective Assessment - 12/02/20 1349     Subjective  Most of the day at home my splint is off and sleep still with it - hand stiff in the morning with some pain - using hand lightly but modify and use fingers more    Pertinent History Ruben Willis is a 75 y.o. who presents today status post [facility] joint left thumb on 10/24/20.  He has had no fevers or chills. Pain under control. Initially denied any numbness tingling or burning sensation but after removal of the t health he had numbness along the ulnar aspect of the thumb and radial aspect of the index finger. 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 Yes    Pain  Score 2     Pain Location Hand    Pain Orientation Left    Pain Descriptors / Indicators Aching;Tightness    Pain Type Surgical pain    Pain Frequency Intermittent                          OT Treatments/Exercises (OP) - 12/02/20 0001       LUE Fluidotherapy   Number Minutes Fluidotherapy 8 Minutes    LUE Fluidotherapy Location Hand;Wrist    Comments AROM for thumb and wrist in all planes              Pt cont with contrast and his voltaren ointment night time for edema- can try epson salt     And cont to use isotoner glove  and then thumb spica when out and about and night time   Check with Dr Roland Rack- but can stop wearing night splint          Keep pain under 2/10 Tendon glides - 10 reps focus on extention  Thumb PA and RA- but do not strain Thumb circular for warm up   PROM for thumb IP and MC with CMC block prior to opposition  Opposition pick up 2 cm foam block  with all digits  5 reps pain free and then  AROM to thumb but only to 2nd ,  3rd ,4th pain free - if pain free can go to 5th - open thumb every time  Wrist AAROM flexion and extention ,  RD, UD - AAROM 10 reps over edge of table - pain free Review agai composite thumb PA to RA - place and hold 10 reps- GENTLE - was pain free this date   3 x day 1 lbs for wrist in all planes - pain free 12 reps  - 2 x day  And rubber band 12 reps PA and RA - 2 x day for both and  if no increase pain  2nd set  in 3 days and if pain free 3 sets in 6 days   2x day   Order [facility] neoprene splint to use during day for increase functional use and strengthening next  week       OT Education - 12/02/20 1749     Education Details progress and HEP    Person(s) Educated Patient    Comprehension Verbal cues required;Returned demonstration;Verbalized understanding              OT Short Term Goals - 11/14/20 1912       OT SHORT TERM GOAL #1   Title Pt to be independent in HEP to decrease edema and pain to initiated AROM of L thumb and wrist with pain less than 3/10    Baseline pain with AROM of thumb and wrist increase 8/10 , edema of 1 cm for MCs mostly in 2nd and 3rd - and thumb    Time 3    Status New    Target Date 12/05/20               OT Long Term Goals - 11/14/20 1913       OT LONG TERM GOAL #1   Title L thumb AROM improve to [facility] to hold cup,turn doorknob and do buttons with symptoms less than 2/10    Baseline L thumb IP , MC 40 each , PA and RA 48 and 60 - pain 8/10    Time 5  Status New    Target Date 12/19/20      OT LONG TERM GOAL #2   Title L wrist AROM /strength increase to [facility] to push up from chair and use in bathing and dressing without symptoms    Baseline thumb spica most all the time - pain 8/10 out of splint with AROM - decrease ROM in all planes for wrist and thumb    Time 8    Period Weeks    Status New    Target Date 01/09/21      OT LONG TERM GOAL #3   Title L grip and prehension strength increase to more than 60% compare to R hand to  pull, push door, hold hammer    Baseline 3 wks s/p  - strength NT    Time 8    Period Weeks    Status New    Target Date 01/09/21                   Plan - 12/02/20 1750     Clinical Impression Statement Pt present about 6  wks s/p L thumb CMC arthroplasty - pt in prefab thumb spica - mostly wearing at night time and when out and about  -Pt edema cont to be present over radial hand and thenar eminence - pt to order [facility] neoprene splint to use for functional strength and use of putty next week-  AROM doing well - pain only with opposition to 4th and 5th but improving  - upgrade HEP for wrist in all planes pain free and resistane for thumb PA and RA - pt to increase reps and sets the next week pain free -  - Reinforce to keep pain under 2/10 -  pt can benefit from cont OT services    OT Occupational Profile and History Problem Focused Assessment - Including review of records relating to presenting problem    Occupational performance deficits (Please refer to evaluation for details): ADL's;IADL's;Work;Play;Leisure;Social Participation    Body Structure / Function / Physical Skills ADL;FMC;Flexibility;Edema;Dexterity;Decreased knowledge of use of DME;Pain;UE functional use;IADL    Rehab Potential Good    Clinical Decision Making Limited treatment options, no task modification necessary    Comorbidities Affecting Occupational Performance: None    Modification or Assistance to Complete Evaluation  No modification of tasks or assist necessary to complete eval    OT Frequency 2x / week    OT Duration 8 weeks    OT Treatment/Interventions Self-care/ADL training;Fluidtherapy;DME and/or AE instruction;Splinting;Contrast Bath;Paraffin;Manual Therapy;Passive range of motion;Therapeutic exercise;Scar mobilization    Consulted and Agree with Plan of Care Patient             Patient will benefit from skilled therapeutic intervention in order to improve the following deficits and impairments:    Body Structure / Function / Physical Skills: ADL, FMC, Flexibility, Edema, Dexterity, Decreased knowledge of use of DME, Pain, UE functional use, IADL       Visit Diagnosis: Pain in left hand  Stiffness of left hand, not elsewhere classified  Stiffness of left wrist, not elsewhere classified  Localized edema  Muscle weakness (generalized)    Problem List There are no problems to display for this patient.   Rosalyn Gess OTR/L,CLT 12/02/2020, 5:54 PM  [facility] [address], [place], [address] Phone: [phone]   Fax:  [phone]  Name: Jabez Molner MRN: [id number] Date of Birth: 14-Jun-1945

## 2020-12-04 DIAGNOSIS — G5601 Carpal tunnel syndrome, right upper limb: Secondary | ICD-10-CM | POA: Insufficient documentation

## 2020-12-10 ENCOUNTER — Encounter: Payer: Medicare Other | Admitting: Occupational Therapy

## 2020-12-11 ENCOUNTER — Ambulatory Visit: Payer: Medicare Other | Admitting: Occupational Therapy

## 2020-12-11 DIAGNOSIS — M6281 Muscle weakness (generalized): Secondary | ICD-10-CM

## 2020-12-11 DIAGNOSIS — M25642 Stiffness of left hand, not elsewhere classified: Secondary | ICD-10-CM

## 2020-12-11 DIAGNOSIS — M79642 Pain in left hand: Secondary | ICD-10-CM | POA: Diagnosis not present

## 2020-12-11 DIAGNOSIS — M25632 Stiffness of left wrist, not elsewhere classified: Secondary | ICD-10-CM

## 2020-12-11 DIAGNOSIS — R600 Localized edema: Secondary | ICD-10-CM

## 2020-12-11 NOTE — Therapy (Signed)
[facility] [address], [place], [address] Phone: [phone]   Fax:  [phone]  Occupational Therapy Treatment  Patient Details  Name: Ruben Willis MRN: [id number] Date of Birth: April 05, 1946 Referring Provider (OT): Dr Roland Rack   Encounter Date: 12/11/2020   OT End of Session - 12/11/20 1549     Visit Number 6    Number of Visits 16    Date for OT Re-Evaluation 01/09/21    OT Start Time [id number]    OT Stop Time 1505    OT Time Calculation (min) 43 min    Activity Tolerance Patient tolerated treatment well    Behavior During Therapy [facility] for tasks assessed/performed             Past Medical History:  Diagnosis Date   Arthritis    Cataract, bilateral    Complete heart block (HCC)    s/p PPM placement   ED (erectile dysfunction)    History of BPH    s/p TURP   History of hiatal hernia    surgically repaired   History of obstructive sleep apnea    resolved following sinus surgery   Hypercholesteremia    Hyperlipidemia    Hypertension    Paroxysmal atrial fibrillation ([place])    Pre-diabetes    Presence of permanent cardiac pacemaker    St.Jude Dual Chamber   Pulmonary embolism ([place]) 06/09/2000   Sinus node dysfunction (HCC)    Skin cancer    removed from nose   SOB (shortness of breath)     Past Surgical History:  Procedure Laterality Date   APPENDECTOMY     CARDIAC CATHETERIZATION  08/13/2011   CARPOMETACARPEL SUSPENSION PLASTY Left 10/23/2020   Procedure: LEFT THUMB [place] SUSPENSION ARTHROPLASTY;  Surgeon: Corky Mull, MD;  Location: [facility] ORS;  Service: Orthopedics;  Laterality: Left;   CATARACT EXTRACTION, BILATERAL     COLONOSCOPY  2014   COSMETIC SURGERY  05/26/2016   eye lids   HAND SURGERY     HERNIA REPAIR     HIATAL   KNEE SURGERY     NASAL SEPTUM SURGERY     OSTEOTOMY AND ULNAR SHORTENING     PACEMAKER PLACEMENT  08/2018   Dr. Trinna Balloon Bumgarner   PALATE SURGERY      PROSTATE SURGERY     SKIN CANCER EXCISION     SKIN GRAFT     VASECTOMY      There were no vitals filed for this visit.   Subjective Assessment - 12/11/20 1547     Subjective  Doing okay -not wearing splint during day in the house - put one on when going out and working in yard- do not have strength in my thumb to try and pick up something    Pertinent History Ruben Willis is a 75 y.o. who presents today status post [facility] joint left thumb on 10/24/20.  He has had no fevers or chills. Pain under control. Initially denied any numbness tingling or burning sensation but after removal of the t health he had numbness along the ulnar aspect of the thumb and radial aspect of the index finger. 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 Yes    Pain  Score 3     Pain Location --   Thumb   Pain Orientation Left    Pain Descriptors / Indicators Aching;Tightness    Pain Type Surgical pain    Pain Onset More than a month ago    Pain Frequency Intermittent                [facility] OT Assessment - 12/11/20 0001       Strength   Right Hand Grip (lbs) 67    Right Hand Lateral Pinch 24 lbs    Right Hand 3 Point Pinch 19 lbs    Left Hand Grip (lbs) 38   pain   Left Hand Lateral Pinch 8 lbs   pain   Left Hand 3 Point Pinch 8 lbs   pain     Left Hand AROM   L Thumb Opposition to Index --   Opposition to all digits- and distal fold of 5th             Assess grip and prehension strength- had pain afterwards - see flowsheet  Thumb PA and RA still decrease - to cont with rubber band HEP         OT Treatments/Exercises (OP) - 12/11/20 0001       LUE Fluidotherapy   Number Minutes Fluidotherapy 8 Minutes    LUE Fluidotherapy Location Hand;Wrist    Comments decrease pain after strength testing              Pt cont with contrast and his voltaren ointment night time for edema- can try epson salt - can use Voltaren in the am - see decrease edema if using      And cont to use isotoner glove        Keep pain under 2/10 Tendon glides - 10 reps  and AROM for  Thumb PA and RA as warmup Thumb circular for warm up   Opposition to all digits  5 -8 reps pain free - slide down 5th - pain free  Wrist AAROM flexion and extention ,  RD, UD warmup Review agai composite thumb PA to RA - place and hold 10 reps- GENTLE - was pain free this date   3 x day 1 lbs for wrist in all planes - pain free 12 reps  - 2 x day And rubber band 12 reps PA and RA - both 3 sets of 12  And then light blue putty add to HEP 1 set of 12 - 2 x day for gripping painfree And then CMC neoprene on for lat and 3 point grip - 12 reps  Increase to 2nd set  in 3 days and if pain free 3 sets in 6 days  2x day            OT Education - 12/11/20 1549     Education Details progress and HEP    Person(s) Educated Patient    Methods Explanation;Demonstration;Tactile cues;Verbal cues    Comprehension Verbal cues required;Returned demonstration;Verbalized understanding              OT Short Term Goals - 11/14/20 1912       OT SHORT TERM GOAL #1   Title Pt to be independent in HEP to decrease edema and pain to initiated AROM of L thumb and wrist with pain less than 3/10    Baseline pain with AROM of thumb and wrist increase 8/10 , edema of 1 cm for MCs mostly in 2nd and 3rd -  and thumb    Time 3    Status New    Target Date 12/05/20               OT Long Term Goals - 11/14/20 1913       OT LONG TERM GOAL #1   Title L thumb AROM improve to [facility] to hold cup,turn doorknob and do buttons with symptoms less than 2/10    Baseline L thumb IP , MC 40 each , PA and RA 48 and 60 - pain 8/10    Time 5    Status New    Target Date 12/19/20      OT LONG TERM GOAL #2   Title L wrist AROM /strength increase to [facility] to  push up from chair and use in bathing and dressing without symptoms    Baseline thumb spica most all the time - pain 8/10 out of splint with AROM - decrease ROM in all planes for wrist and thumb    Time 8    Period Weeks    Status New    Target Date 01/09/21      OT LONG TERM GOAL #3   Title L grip and prehension strength increase to more than 60% compare to R hand to pull, push door, hold hammer    Baseline 3 wks s/p  - strength NT    Time 8    Period Weeks    Status New    Target Date 01/09/21                   Plan - 12/11/20 1552     Clinical Impression Statement Pt present about 7  wks s/p L thumb CMC arthroplasty -  pt most of the day out of splint - only on when out of the house and yardwork - do report at times pain in base of thumb with certain motions but stiff feeling too- Assess this date grip and prehension - pain with all - but able to add putty to HEP - pt to wear CMC neoprene splint for prehension HEP and had no pain - pt to increase reps and sets pain free over the next few day- cont with AROM for digits , thumb and wrist in all planes prior to strengthening - Reinforce to keep pain under 2/10 -  pt can benefit from cont OT services for strengthening    OT Occupational Profile and History Problem Focused Assessment - Including review of records relating to presenting problem    Occupational performance deficits (Please refer to evaluation for details): ADL's;IADL's;Work;Play;Leisure;Social Participation    Body Structure / Function / Physical Skills ADL;FMC;Flexibility;Edema;Dexterity;Decreased knowledge of use of DME;Pain;UE functional use;IADL    Rehab Potential Good    Clinical Decision Making Limited treatment options, no task modification necessary    Comorbidities Affecting Occupational Performance: None    OT Frequency 1x / week    OT Duration 6 weeks    OT Treatment/Interventions Self-care/ADL training;Fluidtherapy;DME and/or AE  instruction;Splinting;Contrast Bath;Paraffin;Manual Therapy;Passive range of motion;Therapeutic exercise;Scar mobilization    Consulted and Agree with Plan of Care Patient             Patient will benefit from skilled therapeutic intervention in order to improve the following deficits and impairments:   Body Structure / Function / Physical Skills: ADL, FMC, Flexibility, Edema, Dexterity, Decreased knowledge of use of DME, Pain, UE functional use, IADL       Visit Diagnosis: Pain in left hand  Stiffness of left hand, not elsewhere classified  Stiffness of left wrist, not elsewhere classified  Localized edema  Muscle weakness (generalized)    Problem List There are no problems to display for this patient.   Rosalyn Gess OTR/L,CLT 12/11/2020, 3:56 PM  [place] [facility] [address] [place], [address] Phone: [phone]   Fax:  [phone]  Name: Carolos Harland MRN: [id number] Date of Birth: 01/29/1946

## 2020-12-17 ENCOUNTER — Ambulatory Visit: Payer: Medicare Other | Attending: Physician Assistant | Admitting: Occupational Therapy

## 2020-12-17 DIAGNOSIS — R600 Localized edema: Secondary | ICD-10-CM | POA: Insufficient documentation

## 2020-12-17 DIAGNOSIS — M79642 Pain in left hand: Secondary | ICD-10-CM | POA: Insufficient documentation

## 2020-12-17 DIAGNOSIS — M6281 Muscle weakness (generalized): Secondary | ICD-10-CM | POA: Insufficient documentation

## 2020-12-17 DIAGNOSIS — M25642 Stiffness of left hand, not elsewhere classified: Secondary | ICD-10-CM | POA: Diagnosis present

## 2020-12-17 DIAGNOSIS — M25632 Stiffness of left wrist, not elsewhere classified: Secondary | ICD-10-CM | POA: Insufficient documentation

## 2020-12-17 NOTE — Therapy (Signed)
[facility] [address], [place], [address] Phone: [phone]   Fax:  [phone]  Occupational Therapy Treatment  Patient Details  Name: Ruben Willis MRN: [id number] Date of Birth: [birth year] Referring Provider (OT): Dr Roland Rack   Encounter Date: 12/17/2020   OT End of Session - 12/17/20 1401     Visit Number 7    Number of Visits 16    Date for OT Re-Evaluation 01/09/21    OT Start Time 1316    OT Stop Time 1400    OT Time Calculation (min) 44 min    Activity Tolerance Patient tolerated treatment well    Behavior During Therapy [facility] for tasks assessed/performed             Past Medical History:  Diagnosis Date   Arthritis    Cataract, bilateral    Complete heart block (HCC)    s/p PPM placement   ED (erectile dysfunction)    History of BPH    s/p TURP   History of hiatal hernia    surgically repaired   History of obstructive sleep apnea    resolved following sinus surgery   Hypercholesteremia    Hyperlipidemia    Hypertension    Paroxysmal atrial fibrillation ([place])    Pre-diabetes    Presence of permanent cardiac pacemaker    St.Jude Dual Chamber   Pulmonary embolism ([place]) 06/09/2000   Sinus node dysfunction (HCC)    Skin cancer    removed from nose   SOB (shortness of breath)     Past Surgical History:  Procedure Laterality Date   APPENDECTOMY     CARDIAC CATHETERIZATION  08/13/2011   CARPOMETACARPEL SUSPENSION PLASTY Left 10/23/2020   Procedure: LEFT THUMB [place] SUSPENSION ARTHROPLASTY;  Surgeon: Corky Mull, MD;  Location: [facility] ORS;  Service: Orthopedics;  Laterality: Left;   CATARACT EXTRACTION, BILATERAL     COLONOSCOPY  2014   COSMETIC SURGERY  05/26/2016   eye lids   HAND SURGERY     HERNIA REPAIR     HIATAL   KNEE SURGERY     NASAL SEPTUM SURGERY     OSTEOTOMY AND ULNAR SHORTENING     PACEMAKER PLACEMENT  08/2018   Dr. Trinna Balloon Bumgarner   PALATE SURGERY      PROSTATE SURGERY     SKIN CANCER EXCISION     SKIN GRAFT     VASECTOMY      There were no vitals filed for this visit.   Subjective Assessment - 12/17/20 1318     Subjective  Done the exercises, doing okay -but my hand swell when wearing my splint - like I am doing my deck over and using my hand as assist- my wife not today here - she has beginning alzheimers and getting little harder - some days more than others    Pertinent History Ruben Willis is a 75 y.o. who presents today status post [facility] joint left thumb on 10/24/20.  He has had no fevers or chills. Pain under control. Initially denied any numbness tingling or burning sensation but after removal of the t health he had numbness along the ulnar aspect of the thumb and radial aspect of the index finger. 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 Yes    Pain Score 4     Pain Location Hand    Pain Orientation Left    Pain Descriptors / Indicators Aching    Pain Type Surgical pain    Pain Onset More than a month ago    Pain Frequency Intermittent                [facility] OT Assessment - 12/17/20 0001       Strength   Left Hand Grip (lbs) 40    Left Hand Lateral Pinch 10 lbs    Left Hand 3 Point Pinch 9 lbs            pt show increase prehension and strength- less pain - had some soreness prior to fluido           OT Treatments/Exercises (OP) - 12/17/20 0001       LUE Fluidotherapy   Number Minutes Fluidotherapy 8 Minutes    LUE Fluidotherapy Location Hand;Wrist    Comments prior to review of new HEP - decrease pain and stiffness               Pt cont with contrast and his voltaren ointment night time for edema- can try epson salt - can use Voltaren in the am - see decrease edema if using And remind again -only 8 wks out -  cannot pick up to heavy things out side - working on his deck at the moment      And cont to use isotoner glove        Keep pain under 2/10 Tendon glides - 10 reps  and AROM for  Thumb PA and RA as warmup Thumb circular for warm up    Opposition to all digits  5 -8 reps pain free - slide down 5th - pain free  Wrist AAROM flexion and extention ,  RD, UD warmup Review agai composite thumb PA to RA - place and hold 10 reps- GENTLE - was pain free this date   3 x day Upgrade to 2 lbs for wrist in all planes - pain free 12 reps  - 2 x day And rubber band 12 reps PA and RA - both 3 sets of 12 Upgrade to teal med putty putty 1 set of 12 - 2 x day for gripping painfree  lat and 3 point grip - 12 reps  2 x day   Did not need CMC neoprene splint today  Increase to 2nd set  in 3 days and if pain free 3 sets in 6 days For 2 lbs weight and med teal putty       OT Education - 12/17/20 1401     Education Details progress and HEP    Person(s) Educated Patient    Methods Explanation;Demonstration;Tactile cues;Verbal cues    Comprehension Verbal cues required;Returned demonstration;Verbalized understanding              OT Short Term Goals - 11/14/20 1912       OT SHORT TERM GOAL #1   Title Pt to be independent in HEP to decrease edema and pain to initiated AROM of L thumb and wrist with pain less than 3/10    Baseline pain with AROM of thumb and wrist increase 8/10 , edema of 1 cm for MCs mostly in 2nd and 3rd - and thumb    Time 3    Status New    Target Date  12/05/20               OT Long Term Goals - 11/14/20 1913       OT LONG TERM GOAL #1   Title L thumb AROM improve to [facility] to hold cup,turn doorknob and do buttons with symptoms less than 2/10    Baseline L thumb IP , MC 40 each , PA and RA 48 and 60 - pain 8/10    Time 5    Status New    Target Date 12/19/20      OT LONG TERM GOAL #2   Title L wrist AROM /strength increase to [facility] to push up from chair and use in  bathing and dressing without symptoms    Baseline thumb spica most all the time - pain 8/10 out of splint with AROM - decrease ROM in all planes for wrist and thumb    Time 8    Period Weeks    Status New    Target Date 01/09/21      OT LONG TERM GOAL #3   Title L grip and prehension strength increase to more than 60% compare to R hand to pull, push door, hold hammer    Baseline 3 wks s/p  - strength NT    Time 8    Period Weeks    Status New    Target Date 01/09/21                   Plan - 12/17/20 1401     Clinical Impression Statement Pt present about 8  wks s/p L thumb CMC arthroplasty -  pt most of the day out of splint - only on when out of the house and yardwork - report some increase edema when working out outside - reminded pt again his only 8 wks s/p - pt AROM WFL - sometimes stiffness but  decrease after using some heat - able to upgrade his weight and putty resistance today pain free- and able to do HEP without CMC neoprene splint - reminded to back off when feeling pull -  pt to increase reps and sets pain free over the next few day- cont with AROM for digits , thumb and wrist in all planes prior to strengthening - Reinforce to keep pain under 2/10 -  pt can benefit from cont OT services for strengthening    OT Occupational Profile and History Problem Focused Assessment - Including review of records relating to presenting problem    Occupational performance deficits (Please refer to evaluation for details): ADL's;IADL's;Work;Play;Leisure;Social Participation    Body Structure / Function / Physical Skills ADL;FMC;Flexibility;Edema;Dexterity;Decreased knowledge of use of DME;Pain;UE functional use;IADL    Rehab Potential Good    Clinical Decision Making Limited treatment options, no task modification necessary    Comorbidities Affecting Occupational Performance: None    Modification or Assistance to Complete Evaluation  No modification of tasks or assist necessary to  complete eval    OT Frequency 1x / week    OT Duration 6 weeks    OT Treatment/Interventions Self-care/ADL training;Fluidtherapy;DME and/or AE instruction;Splinting;Contrast Bath;Paraffin;Manual Therapy;Passive range of motion;Therapeutic exercise;Scar mobilization    Consulted and Agree with Plan of Care Patient             Patient will benefit from skilled therapeutic intervention in order to improve the following deficits and impairments:   Body Structure / Function / Physical Skills: ADL, FMC, Flexibility, Edema, Dexterity, Decreased knowledge of use of DME, Pain,  UE functional use, IADL       Visit Diagnosis: Pain in left hand  Stiffness of left hand, not elsewhere classified  Stiffness of left wrist, not elsewhere classified  Localized edema    Problem List There are no problems to display for this patient.   Rosalyn Gess OTR/L,CLT 12/17/2020, 5:11 PM  [facility] [address] [place], [address] Phone: [phone]   Fax:  [phone]  Name: Ruben Willis MRN: [id number] Date of Birth: 08-26-1945

## 2020-12-27 ENCOUNTER — Ambulatory Visit: Payer: Medicare Other | Admitting: Occupational Therapy

## 2020-12-27 ENCOUNTER — Other Ambulatory Visit: Payer: Self-pay

## 2020-12-27 DIAGNOSIS — M6281 Muscle weakness (generalized): Secondary | ICD-10-CM

## 2020-12-27 DIAGNOSIS — M79642 Pain in left hand: Secondary | ICD-10-CM

## 2020-12-27 DIAGNOSIS — M25642 Stiffness of left hand, not elsewhere classified: Secondary | ICD-10-CM

## 2020-12-27 DIAGNOSIS — R600 Localized edema: Secondary | ICD-10-CM

## 2020-12-27 DIAGNOSIS — M25632 Stiffness of left wrist, not elsewhere classified: Secondary | ICD-10-CM

## 2020-12-27 NOTE — Therapy (Signed)
[facility] [address], [place], [address] Phone: [phone]   Fax:  [phone]  Occupational Therapy Treatment  Patient Details  Name: Ruben Willis MRN: [id number] Date of Birth: 10-Jul-1945 Referring Provider (OT): Dr Roland Rack   Encounter Date: 12/27/2020   OT End of Session - 12/27/20 1201     Visit Number 8    Number of Visits 16    Date for OT Re-Evaluation 01/09/21    OT Start Time 1124    OT Stop Time 1155    OT Time Calculation (min) 31 min    Activity Tolerance Patient tolerated treatment well    Behavior During Therapy [facility] for tasks assessed/performed             Past Medical History:  Diagnosis Date   Arthritis    Cataract, bilateral    Complete heart block (HCC)    s/p PPM placement   ED (erectile dysfunction)    History of BPH    s/p TURP   History of hiatal hernia    surgically repaired   History of obstructive sleep apnea    resolved following sinus surgery   Hypercholesteremia    Hyperlipidemia    Hypertension    Paroxysmal atrial fibrillation ([place])    Pre-diabetes    Presence of permanent cardiac pacemaker    St.Jude Dual Chamber   Pulmonary embolism ([place]) 06/09/2000   Sinus node dysfunction (HCC)    Skin cancer    removed from nose   SOB (shortness of breath)     Past Surgical History:  Procedure Laterality Date   APPENDECTOMY     CARDIAC CATHETERIZATION  08/13/2011   CARPOMETACARPEL SUSPENSION PLASTY Left 10/23/2020   Procedure: LEFT THUMB [place] SUSPENSION ARTHROPLASTY;  Surgeon: Corky Mull, MD;  Location: [facility] ORS;  Service: Orthopedics;  Laterality: Left;   CATARACT EXTRACTION, BILATERAL     COLONOSCOPY  2014   COSMETIC SURGERY  05/26/2016   eye lids   HAND SURGERY     HERNIA REPAIR     HIATAL   KNEE SURGERY     NASAL SEPTUM SURGERY     OSTEOTOMY AND ULNAR SHORTENING     PACEMAKER PLACEMENT  08/2018   Dr. Trinna Balloon Bumgarner   PALATE SURGERY      PROSTATE SURGERY     SKIN CANCER EXCISION     SKIN GRAFT     VASECTOMY      There were no vitals filed for this visit.   Subjective Assessment - 12/27/20 1200     Subjective  Doing okay- wearing soft splint only when outside - putty and weight doing okay - using it- no pain more discomfort    Pertinent History Ruben Willis is a 75 y.o. who presents today status post [facility] joint left thumb on 10/24/20.  He has had no fevers or chills. Pain under control. Initially denied any numbness tingling or burning sensation but after removal of the t health he had numbness along the ulnar aspect of the thumb and radial aspect of the index finger. 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 No/denies                [facility] OT  Assessment - 12/27/20 0001       Strength   Right Hand Grip (lbs) 67    Right Hand Lateral Pinch 24 lbs    Left Hand Grip (lbs) 53    Left Hand Lateral Pinch 14 lbs    Left Hand 3 Point Pinch 10 lbs            AROM WFL in all planes- thumb IP and MC flexion 50 now each  And strength in wrist 5/5 in all planes - in sup /pron do not use thumb yet to assist with twisting- still guarding some Thumb PA and RA 4+/5    Pt denies edema this week    And cont to use isotoner glove as needed      Keep pain under 2/10 Tendon glides - 10 reps  and AROM for  Thumb PA and RA as warmup Thumb circular for warm up  As needed    Cont with  2 lbs for wrist in all planes - pain free  3 sets of 12 reps  - 2 x day  And  upgrade to 2 rubber band 12 reps PA  and one band for RA - both 1 set of 12 Upgrade to med  firm green  putty 1 set of 12 - 2 x day for gripping painfree  lat and 3 point grip - 12 reps  2 x day   Did not need CMC neoprene splint today  Increase to 2nd set  in 3 days and if  pain free 3 sets in 6 days And rubber band         OT Education - 12/27/20 1201     Education Details progress and HEP    Person(s) Educated Patient    Methods Explanation;Demonstration;Tactile cues;Verbal cues    Comprehension Verbal cues required;Returned demonstration;Verbalized understanding              OT Short Term Goals - 11/14/20 1912       OT SHORT TERM GOAL #1   Title Pt to be independent in HEP to decrease edema and pain to initiated AROM of L thumb and wrist with pain less than 3/10    Baseline pain with AROM of thumb and wrist increase 8/10 , edema of 1 cm for MCs mostly in 2nd and 3rd - and thumb    Time 3    Status New    Target Date 12/05/20               OT Long Term Goals - 11/14/20 1913       OT LONG TERM GOAL #1   Title L thumb AROM improve to [facility] to hold cup,turn doorknob and do buttons with symptoms less than 2/10    Baseline L thumb IP , MC 40 each , PA and RA 48 and 60 - pain 8/10    Time 5    Status New    Target Date 12/19/20      OT LONG TERM GOAL #2   Title L wrist AROM /strength increase to [facility] to push up from chair and use in bathing and dressing without symptoms    Baseline thumb spica most all the time - pain 8/10 out of splint with AROM - decrease ROM in all planes for wrist and thumb    Time 8    Period Weeks    Status New    Target Date 01/09/21      OT LONG TERM GOAL #  3   Title L grip and prehension strength increase to more than 60% compare to R hand to pull, push door, hold hammer    Baseline 3 wks s/p  - strength NT    Time 8    Period Weeks    Status New    Target Date 01/09/21                   Plan - 12/27/20 1202     Clinical Impression Statement Pt present about 9  wks s/p L thumb CMC arthroplasty -  pt most of the day out of splint - only on when out of the house and yardwork - report more discomfort than pain  - pt AROM WFL - sometimes stiffness but  decrease after doing exercises -Pt to cont  with 2 lbs weight but can do 2 bands for PA of thumb -and upgrade to med firm green putty for gripping / prehension strength - had no pain in clinic today - pt to gradually increase sets over the next 10 days - pt was able to do HEP without CMC neoprene splint today - reminded to back off when feeling pull -   cont with AROM for digits , thumb and wrist in all planes prior to strengthening as needed - Reinforce to keep pain under 2/10 -  pt can benefit from cont OT services for strengthening    OT Occupational Profile and History Problem Focused Assessment - Including review of records relating to presenting problem    Occupational performance deficits (Please refer to evaluation for details): ADL's;IADL's;Work;Play;Leisure;Social Participation    Body Structure / Function / Physical Skills ADL;FMC;Flexibility;Edema;Dexterity;Decreased knowledge of use of DME;Pain;UE functional use;IADL    Rehab Potential Good    Clinical Decision Making Limited treatment options, no task modification necessary    Comorbidities Affecting Occupational Performance: None    Modification or Assistance to Complete Evaluation  No modification of tasks or assist necessary to complete eval    OT Frequency Biweekly    OT Duration 6 weeks    OT Treatment/Interventions Self-care/ADL training;Fluidtherapy;DME and/or AE instruction;Splinting;Contrast Bath;Paraffin;Manual Therapy;Passive range of motion;Therapeutic exercise;Scar mobilization    Consulted and Agree with Plan of Care Patient             Patient will benefit from skilled therapeutic intervention in order to improve the following deficits and impairments:   Body Structure / Function / Physical Skills: ADL, FMC, Flexibility, Edema, Dexterity, Decreased knowledge of use of DME, Pain, UE functional use, IADL       Visit Diagnosis: Pain in left hand  Stiffness of left hand, not elsewhere classified  Stiffness of left wrist, not elsewhere  classified  Localized edema  Muscle weakness (generalized)    Problem List There are no problems to display for this patient.   Rosalyn Gess OTR/L,CLT 12/27/2020, 12:07 PM  [facility] [address], [place], [address] Phone: [phone]   Fax:  [phone]  Name: Ruben Willis MRN: [id number] Date of Birth: May 20, 1945

## 2021-01-07 ENCOUNTER — Ambulatory Visit: Payer: Medicare Other | Admitting: Occupational Therapy

## 2021-01-07 DIAGNOSIS — M79642 Pain in left hand: Secondary | ICD-10-CM

## 2021-01-07 DIAGNOSIS — M25642 Stiffness of left hand, not elsewhere classified: Secondary | ICD-10-CM

## 2021-01-07 DIAGNOSIS — R600 Localized edema: Secondary | ICD-10-CM

## 2021-01-07 DIAGNOSIS — M6281 Muscle weakness (generalized): Secondary | ICD-10-CM

## 2021-01-07 NOTE — Therapy (Signed)
[facility] [address], [place], [address] Phone: [phone]   Fax:  [phone]  Occupational Therapy Treatment  Patient Details  Name: Ruben Willis MRN: [id number] Date of Birth: Oct 29, 1945 Referring Provider (OT): Dr Roland Rack   Encounter Date: 01/07/2021   OT End of Session - 01/07/21 1154     Visit Number 9    Number of Visits 16    Date for OT Re-Evaluation 01/09/21    OT Start Time 1120    OT Stop Time 1145    OT Time Calculation (min) 25 min    Activity Tolerance Patient tolerated treatment well    Behavior During Therapy [facility] for tasks assessed/performed             Past Medical History:  Diagnosis Date   Arthritis    Cataract, bilateral    Complete heart block (HCC)    s/p PPM placement   ED (erectile dysfunction)    History of BPH    s/p TURP   History of hiatal hernia    surgically repaired   History of obstructive sleep apnea    resolved following sinus surgery   Hypercholesteremia    Hyperlipidemia    Hypertension    Paroxysmal atrial fibrillation ([place])    Pre-diabetes    Presence of permanent cardiac pacemaker    [employer]ude Dual Chamber   Pulmonary embolism ([place]) 06/09/2000   Sinus node dysfunction (HCC)    Skin cancer    removed from nose   SOB (shortness of breath)     Past Surgical History:  Procedure Laterality Date   APPENDECTOMY     CARDIAC CATHETERIZATION  08/13/2011   CARPOMETACARPEL SUSPENSION PLASTY Left 10/23/2020   Procedure: LEFT THUMB [place] SUSPENSION ARTHROPLASTY;  Surgeon: Corky Mull, MD;  Location: [facility] ORS;  Service: Orthopedics;  Laterality: Left;   CATARACT EXTRACTION, BILATERAL     COLONOSCOPY  2014   COSMETIC SURGERY  05/26/2016   eye lids   HAND SURGERY     HERNIA REPAIR     HIATAL   KNEE SURGERY     NASAL SEPTUM SURGERY     OSTEOTOMY AND ULNAR SHORTENING     PACEMAKER PLACEMENT  08/2018   Dr. Trinna Balloon Bumgarner   PALATE SURGERY      PROSTATE SURGERY     SKIN CANCER EXCISION     SKIN GRAFT     VASECTOMY      There were no vitals filed for this visit.   Subjective Assessment - 01/07/21 1154     Subjective  I am doing well - using my hand to built the deck, mow the lawn- no pain - just stiff    Pertinent History Ruben Willis is a 75 y.o. who presents today status post [facility] joint left thumb on 10/24/20.  He has had no fevers or chills. Pain under control. Initially denied any numbness tingling or burning sensation but after removal of the t health he had numbness along the ulnar aspect of the thumb and radial aspect of the index finger. 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 No/denies                [facility] OT Assessment -  01/07/21 0001       AROM   Left Wrist Extension 65 Degrees    Left Wrist Flexion 65 Degrees      Strength   Right Hand Grip (lbs) 64    Right Hand Lateral Pinch 24 lbs    Right Hand 3 Point Pinch 16 lbs    Left Hand Grip (lbs) 55    Left Hand Lateral Pinch 14 lbs    Left Hand 3 Point Pinch 12 lbs      Left Hand AROM   L Thumb Opposition to Index --   Opposition to base of 5th                   AROM WFL in all planes- thumb IP and MC flexion 50 now each  And strength in wrist and forearm sup/pro  5/5 in all planes -Thumb PA and RA 5/5      Pt denies edema this week  and pain     Keep pain under 2/10 Tendon glides - 10 reps  and AROM for  Thumb PA and RA as warmup Thumb circular for warm up  As needed or in the am in shower    Can use his 3 lbs weight for sup/pro, RD, UD -and some elbow and shoulder condition exercises - 1 x day 12 reps  For 2 wks and then stop    Cont 2 rubber band 12 reps PA  and one band for RA - both 1 set of 12 for 2 more wks and then  stop  Upgrade to firm  dark blue putty 1 set of 12 - 1-2 x day for gripping painfree  lat and 3 point grip - 12 reps  increase  to 2nd set  in 3 days and if pain free 3 sets in 6 days Stop in 2-3 wks              OT Education - 01/07/21 1154     Education Details progress and HEP    Person(s) Educated Patient    Methods Explanation;Demonstration;Tactile cues;Verbal cues    Comprehension Verbal cues required;Returned demonstration;Verbalized understanding              OT Short Term Goals - 01/07/21 1157       OT SHORT TERM GOAL #1   Title Pt to be independent in HEP to decrease edema and pain to initiated AROM of L thumb and wrist with pain less than 3/10    Status Achieved               OT Long Term Goals - 01/07/21 1157       OT LONG TERM GOAL #1   Title L thumb AROM improve to [facility] to hold cup,turn doorknob and do buttons with symptoms less than 2/10    Status Achieved      OT LONG TERM GOAL #2   Title L wrist AROM /strength increase to [facility] to push up from chair and use in bathing and dressing without symptoms    Status Achieved      OT LONG TERM GOAL #3   Title L grip and prehension strength increase to more than 60% compare to R hand to pull, push door, hold hammer    Status Achieved                   Plan - 01/07/21 1155     Clinical Impression Statement Pt is this week 11  wks s/p L thumb [facility]MC arthroplasty - made great progress in AROM at wrist and thumb , increase strength at thumb and wrist to 5/10 - grip increase but lat grip still decrease -but do not have pain with functional use - did upgrade his putty for grip and prehension but to keep pain free- and cont to increase use of L hand in his activities of building and fixing things - pt to cont with HEP until appt with surgeon and if needed can check back with me in month or otherwise will discharge him    OT Occupational Profile and History Problem Focused Assessment - Including review of records relating to  presenting problem    Occupational performance deficits (Please refer to evaluation for details): ADL's;IADL's;Work;Play;Leisure;Social Participation    Body Structure / Function / Physical Skills ADL;FMC;Flexibility;Edema;Dexterity;Decreased knowledge of use of DME;Pain;UE functional use;IADL    Rehab Potential Good    Clinical Decision Making Limited treatment options, no task modification necessary    Comorbidities Affecting Occupational Performance: None    Modification or Assistance to Complete Evaluation  No modification of tasks or assist necessary to complete eval    OT Frequency Monthly    OT Duration 4 weeks    OT Treatment/Interventions Self-care/ADL training;Fluidtherapy;DME and/or AE instruction;Splinting;Contrast Bath;Paraffin;Manual Therapy;Passive range of motion;Therapeutic exercise;Scar mobilization    Consulted and Agree with Plan of Care Patient             Patient will benefit from skilled therapeutic intervention in order to improve the following deficits and impairments:   Body Structure / Function / Physical Skills: ADL, FMC, Flexibility, Edema, Dexterity, Decreased knowledge of use of DME, Pain, UE functional use, IADL       Visit Diagnosis: Pain in left hand  Stiffness of left hand, not elsewhere classified  Localized edema  Muscle weakness (generalized)    Problem List There are no problems to display for this patient.   Rosalyn Gess OTR/L,CLT 01/07/2021, 11:58 AM  [facility] [address] [place], [address] Phone: [phone]   Fax:  [phone]  Name: Lemond Mckinney MRN: [id number] Date of Birth: Sep 20, 1945

## 2021-05-21 DIAGNOSIS — F418 Other specified anxiety disorders: Secondary | ICD-10-CM | POA: Insufficient documentation

## 2021-05-23 DIAGNOSIS — E538 Deficiency of other specified B group vitamins: Secondary | ICD-10-CM | POA: Insufficient documentation

## 2021-06-09 ENCOUNTER — Emergency Department
Admission: EM | Admit: 2021-06-09 | Discharge: 2021-06-09 | Disposition: A | Discharge status: Home / Self Care | Payer: Medicare Other | Attending: Emergency Medicine | Admitting: Emergency Medicine

## 2021-06-09 ENCOUNTER — Emergency Department: Payer: Medicare Other

## 2021-06-09 ENCOUNTER — Other Ambulatory Visit: Payer: Self-pay

## 2021-06-09 ENCOUNTER — Ambulatory Visit: Admission: EM | Admit: 2021-06-09 | Discharge: 2021-06-09 | Payer: Medicare Other

## 2021-06-09 DIAGNOSIS — I10 Essential (primary) hypertension: Secondary | ICD-10-CM | POA: Diagnosis not present

## 2021-06-09 DIAGNOSIS — S0990XA Unspecified injury of head, initial encounter: Secondary | ICD-10-CM | POA: Diagnosis present

## 2021-06-09 DIAGNOSIS — Y9302 Activity, running: Secondary | ICD-10-CM | POA: Diagnosis not present

## 2021-06-09 DIAGNOSIS — Y9289 Other specified places as the place of occurrence of the external cause: Secondary | ICD-10-CM | POA: Diagnosis not present

## 2021-06-09 DIAGNOSIS — Z7901 Long term (current) use of anticoagulants: Secondary | ICD-10-CM | POA: Diagnosis not present

## 2021-06-09 DIAGNOSIS — M545 Low back pain, unspecified: Secondary | ICD-10-CM | POA: Diagnosis not present

## 2021-06-09 DIAGNOSIS — I480 Paroxysmal atrial fibrillation: Secondary | ICD-10-CM | POA: Diagnosis not present

## 2021-06-09 DIAGNOSIS — W01198A Fall on same level from slipping, tripping and stumbling with subsequent striking against other object, initial encounter: Secondary | ICD-10-CM | POA: Insufficient documentation

## 2021-06-09 DIAGNOSIS — W19XXXA Unspecified fall, initial encounter: Secondary | ICD-10-CM

## 2021-06-09 MED ORDER — ACETAMINOPHEN 500 MG PO TABS
1000.0000 mg | ORAL_TABLET | Freq: Once | ORAL | Status: AC
Start: 2021-06-09 — End: 2021-06-09
  Administered 2021-06-09: 1000 mg via ORAL
  Filled 2021-06-09: qty 2

## 2021-06-09 MED ORDER — LIDOCAINE 5 % EX PTCH
1.0000 | MEDICATED_PATCH | CUTANEOUS | Status: DC
  Administered 2021-06-09: 1 via TRANSDERMAL
  Filled 2021-06-09: qty 1

## 2021-06-09 NOTE — ED Notes (Signed)
Patient is being discharged from the Urgent Care and sent to the Emergency Department via POV . Per Alroy Dust, NP, patient is in need of higher level of care for evaluation of head injury and CT. Patient is aware and verbalizes understanding of plan of care.  Vitals:   06/09/21 1358  BP: (!) 161/94  Pulse: 60  Resp: 18  SpO2: 98%

## 2021-06-09 NOTE — Discharge Instructions (Signed)
Use Tylenol for pain and fevers.  Up to 1000 mg per dose, up to 4 times per day.  Do not take more than 4000 mg of Tylenol/acetaminophen within 24 hours..  

## 2021-06-09 NOTE — ED Triage Notes (Signed)
Pt to ED from UC for fall on Saturday. States slipped while playing with grandson and hit back of head. Denies LOC.  Reports neck pain when waking up this morning and headache +blood thinner  Denies emesis Ambulatory, NAD noted. Steady gait

## 2021-06-09 NOTE — ED Triage Notes (Signed)
Patient presents to Urgent Care for a fall on Saturday hitting the back of head on his hardwood floor. He states this morning he developed sharp pain in the neck/shoulder pain and a headache. Pt is on Eliquis.  Treating pain with Tylenol.   Denies LOC.

## 2021-06-09 NOTE — ED Notes (Signed)
Provider was notified of elevated BP and Fall. Instructed patient to go the ED.

## 2021-06-09 NOTE — ED Provider Notes (Signed)
[facility] Provider Note    Event Date/Time   First MD Initiated Contact with Patient 06/09/21 1604     (approximate)   History   Fall   HPI  Ruben Willis is a 76 y.o. male who presents to the ED for evaluation of Fall   Review outpatient PCP visit from 1/4.  History of HTN, HLD, paroxysmal A. fib on Eliquis.  Pacemaker in place due to history of CHB.  Patient presents to the ED for the ration of a fall that occurred 2 days ago.  Patient reports mechanical fall that occurred 2 days ago while playing with his grandchild.  He reports running around the house and socks on a hard surface, come to a stop but his feet sliding out from under him, causing him to fall backwards and strike his occiput on the floor.  Denies any syncope associated with the event, any dizziness, chest pain or precipitating features beyond slipping and falling.  Reports he got up and has been ambulatory since the event, but starting yesterday developed some aching pain to his neck and lower back.   No further falls and no syncopal episodes.   Physical Exam   Triage Vital Signs: ED Triage Vitals  Enc Vitals Group     BP 06/09/21 1516 (!) 166/97     Pulse Rate 06/09/21 1516 62     Resp 06/09/21 1516 20     Temp 06/09/21 1516 98.3 F (36.8 C)     Temp Source 06/09/21 1516 Oral     SpO2 06/09/21 1516 96 %     Weight 06/09/21 1517 215 lb (97.5 kg)     Height 06/09/21 1517 5\' 9"  (1.753 m)     Head Circumference --      Peak Flow --      Pain Score 06/09/21 1517 6     Pain Loc --      Pain Edu? --      Excl. in [place]? --     Most recent vital signs: Vitals:   06/09/21 1516  BP: (!) 166/97  Pulse: 62  Resp: 20  Temp: 98.3 F (36.8 C)  SpO2: 96%    General: Awake, no distress.  Ambulatory with normal gait. CV:  Good peripheral perfusion.  Resp:  Normal effort.  Abd:  No distention.  MSK:  No deformity noted.  Mild right-sided paraspinal lumbar tenderness to  palpation without overlying skin changes or signs of trauma.  No midline spinal tenderness throughout. Neuro:  No focal deficits appreciated. Cranial nerves II through XII intact 5/5 strength and sensation in all 4 extremities Other:     ED Results / Procedures / Treatments   Labs (all labs ordered are listed, but only abnormal results are displayed) Labs Reviewed - No data to display  EKG   RADIOLOGY CT head and neck reviewed by me without evidence of ICH or cervical fracture, respectively. Plain films of the lumbar spine reviewed by me without evidence of acute fracture or dislocation.  Official radiology report(s): DG Lumbar Spine Complete  Result Date: 06/09/2021 CLINICAL DATA:  Fall, back pain EXAM: LUMBAR SPINE - COMPLETE 4+ VIEW COMPARISON:  07/12/2016 FINDINGS: Five lumbar type vertebral segments. Vertebral body heights and alignment are maintained. No fracture identified. Slight progression of advanced multilevel intervertebral disc space loss with associated degenerative endplate changes. Prominent lower lumbar facet arthrosis. Diffuse osseous demineralization. Aortoiliac vascular calcifications. IMPRESSION: 1. No evidence of acute fracture or static subluxation. 2.  Advanced multilevel lumbar spondylosis. Electronically Signed   By: Davina Poke D.O.   On: 06/09/2021 16:59   CT HEAD WO CONTRAST (5MM)  Result Date: 06/09/2021 CLINICAL DATA:  The patient fell and hit the back of his head a hardwood floor 2 days ago. Headache and sharp neck and shoulder pain today. Taking Eliquis. EXAM: CT HEAD WITHOUT CONTRAST CT CERVICAL SPINE WITHOUT CONTRAST TECHNIQUE: Multidetector CT imaging of the head and cervical spine was performed following the standard protocol without intravenous contrast. Multiplanar CT image reconstructions of the cervical spine were also generated. RADIATION DOSE REDUCTION: This exam was performed according to the departmental dose-optimization program which  includes automated exposure control, adjustment of the mA and/or kV according to patient size and/or use of iterative reconstruction technique. COMPARISON:  08/04/2019 FINDINGS: CT HEAD FINDINGS Brain: Stable mildly enlarged ventricles and cortical sulci. Stable mild patchy white matter low density in both cerebral hemispheres. No intracranial hemorrhage, mass lesion or CT evidence of acute infarction. Vascular: No hyperdense vessel or unexpected calcification. Skull: Normal. Negative for fracture or focal lesion. Sinuses/Orbits: Unremarkable. Other: None. CT CERVICAL SPINE FINDINGS Alignment: Stable mild retrolisthesis at the C2-3, C3-4 and C5-6 levels and mild anterolisthesis at the C5-6 and C7-T1 levels. Skull base and vertebrae: No acute fracture. No primary bone lesion or focal pathologic process. Soft tissues and spinal canal: No prevertebral fluid or swelling. No visible canal hematoma. Disc levels: Multilevel degenerative changes without significant change. Upper chest: Clear lung apices. Other: Mild bilateral carotid artery calcifications. IMPRESSION: 1. No skull fracture or intracranial hemorrhage. 2. Cervical spine fracture or traumatic subluxation. 3. Stable mild diffuse cerebral atrophy and minimal small vessel white matter ischemic changes in both cerebral hemispheres. 4. Stable cervical spine degenerative changes. 5. Mild bilateral carotid artery atheromatous calcifications. Electronically Signed   By: Claudie Revering M.D.   On: 06/09/2021 15:57   CT Cervical Spine Wo Contrast  Result Date: 06/09/2021 CLINICAL DATA:  The patient fell and hit the back of his head a hardwood floor 2 days ago. Headache and sharp neck and shoulder pain today. Taking Eliquis. EXAM: CT HEAD WITHOUT CONTRAST CT CERVICAL SPINE WITHOUT CONTRAST TECHNIQUE: Multidetector CT imaging of the head and cervical spine was performed following the standard protocol without intravenous contrast. Multiplanar CT image reconstructions of  the cervical spine were also generated. RADIATION DOSE REDUCTION: This exam was performed according to the departmental dose-optimization program which includes automated exposure control, adjustment of the mA and/or kV according to patient size and/or use of iterative reconstruction technique. COMPARISON:  08/04/2019 FINDINGS: CT HEAD FINDINGS Brain: Stable mildly enlarged ventricles and cortical sulci. Stable mild patchy white matter low density in both cerebral hemispheres. No intracranial hemorrhage, mass lesion or CT evidence of acute infarction. Vascular: No hyperdense vessel or unexpected calcification. Skull: Normal. Negative for fracture or focal lesion. Sinuses/Orbits: Unremarkable. Other: None. CT CERVICAL SPINE FINDINGS Alignment: Stable mild retrolisthesis at the C2-3, C3-4 and C5-6 levels and mild anterolisthesis at the C5-6 and C7-T1 levels. Skull base and vertebrae: No acute fracture. No primary bone lesion or focal pathologic process. Soft tissues and spinal canal: No prevertebral fluid or swelling. No visible canal hematoma. Disc levels: Multilevel degenerative changes without significant change. Upper chest: Clear lung apices. Other: Mild bilateral carotid artery calcifications. IMPRESSION: 1. No skull fracture or intracranial hemorrhage. 2. Cervical spine fracture or traumatic subluxation. 3. Stable mild diffuse cerebral atrophy and minimal small vessel white matter ischemic changes in both cerebral hemispheres. 4. Stable cervical  spine degenerative changes. 5. Mild bilateral carotid artery atheromatous calcifications. Electronically Signed   By: Claudie Revering M.D.   On: 06/09/2021 15:57    PROCEDURES and INTERVENTIONS:  Procedures  Medications  lidocaine (LIDODERM) 5 % 1 patch (1 patch Transdermal Patch Applied 06/09/21 1641)  acetaminophen (TYLENOL) tablet 1,000 mg (1,000 mg Oral Given 06/09/21 1641)     IMPRESSION / MDM / ASSESSMENT AND PLAN / ED COURSE  I reviewed the triage vital  signs and the nursing notes.  Fairly healthy and functional 76 year old male presents to the ED for evaluation after a mechanical fall, without evidence of acute pathology and suitable for outpatient management.  He looks clinically well without evidence of neurologic or vascular deficits.  No significant signs of trauma or deformity, but has some mild tenderness to right-sided paraspinal lumbar back.  Plain films of the lumbar spine without evidence of fracture or dislocation, and CT imaging of the head and neck are similarly reassuring without signs of ICH or cervical fracture.  We discussed nonnarcotic analgesia for his aching pains, fall precautions at home and the importance of following up more closely after a fall considering his anticoagulation.  Return precautions for the ED discussed.  Patient suitable for outpatient management.      FINAL CLINICAL IMPRESSION(S) / ED DIAGNOSES   Final diagnoses:  Fall, initial encounter  Injury of head, initial encounter     Rx / DC Orders   ED Discharge Orders     None        Note:  This document was prepared using Dragon voice recognition software and may include unintentional dictation errors.   Vladimir Crofts, MD 06/09/21 [phone]

## 2021-07-06 ENCOUNTER — Emergency Department: Payer: Medicare Other

## 2021-07-06 ENCOUNTER — Emergency Department
Admission: EM | Admit: 2021-07-06 | Discharge: 2021-07-06 | Disposition: A | Discharge status: Home / Self Care | Payer: Medicare Other | Attending: Emergency Medicine | Admitting: Emergency Medicine

## 2021-07-06 ENCOUNTER — Other Ambulatory Visit: Payer: Self-pay

## 2021-07-06 ENCOUNTER — Encounter: Payer: Self-pay | Admitting: Emergency Medicine

## 2021-07-06 DIAGNOSIS — R208 Other disturbances of skin sensation: Secondary | ICD-10-CM | POA: Diagnosis not present

## 2021-07-06 DIAGNOSIS — J069 Acute upper respiratory infection, unspecified: Secondary | ICD-10-CM

## 2021-07-06 DIAGNOSIS — I10 Essential (primary) hypertension: Secondary | ICD-10-CM | POA: Diagnosis not present

## 2021-07-06 DIAGNOSIS — Z20822 Contact with and (suspected) exposure to covid-19: Secondary | ICD-10-CM | POA: Insufficient documentation

## 2021-07-06 DIAGNOSIS — R490 Dysphonia: Secondary | ICD-10-CM | POA: Diagnosis not present

## 2021-07-06 DIAGNOSIS — R059 Cough, unspecified: Secondary | ICD-10-CM | POA: Diagnosis present

## 2021-07-06 LAB — RESP PANEL BY RT-PCR (FLU A&B, COVID) ARPGX2
Influenza A by PCR: NEGATIVE
Influenza B by PCR: NEGATIVE
SARS Coronavirus 2 by RT PCR: NEGATIVE

## 2021-07-06 MED ORDER — PREDNISONE 20 MG PO TABS: 20.0000 mg | ORAL_TABLET | Freq: Two times a day (BID) | ORAL | 0 refills | Status: AC

## 2021-07-06 MED ORDER — ALBUTEROL SULFATE HFA 108 (90 BASE) MCG/ACT IN AERS: 2.0000 | INHALATION_SPRAY | Freq: Four times a day (QID) | RESPIRATORY_TRACT | 0 refills | Status: AC | PRN

## 2021-07-06 MED ORDER — BENZONATATE 100 MG PO CAPS
ORAL_CAPSULE | ORAL | 0 refills | Status: AC
Start: 2021-07-06 — End: ?

## 2021-07-06 NOTE — ED Provider Notes (Signed)
[facility] Emergency Department Provider Note     None    (approximate)   History   Sore Throat, Cough, and Hoarse   HPI  Ruben Willis is a 76 y.o. male with a history of hypertension, hyperlipidemia, A-fib, and heart block, presents to the ED with complaints of sore throat, hoarse voice, and productive cough for the last 2 to 3 days.  Patient denies any frank fevers, shortness of breath, or abdominal pain.  He does report a burning sensation in the chest associated with a cough.  He has been taking Tylenol intermittently for symptom relief.  Patient denies any sick contacts, however he did visit his wife at the rehab facility yesterday.  He reports wearing his mask throughout the visit.  Physical Exam   Triage Vital Signs: ED Triage Vitals  Enc Vitals Group     BP 07/06/21 1032 (!) 153/66     Pulse Rate 07/06/21 1032 65     Resp 07/06/21 1032 18     Temp 07/06/21 1032 97.9 F (36.6 C)     Temp Source 07/06/21 1032 Oral     SpO2 07/06/21 1032 98 %     Weight 07/06/21 1029 204 lb (92.5 kg)     Height 07/06/21 1029 5\' 9"  (1.753 m)     Head Circumference --      Peak Flow --      Pain Score 07/06/21 1029 6     Pain Loc --      Pain Edu? --      Excl. in [place]? --     Most recent vital signs: Vitals:   07/06/21 1032  BP: (!) 153/66  Pulse: 65  Resp: 18  Temp: 97.9 F (36.6 C)  SpO2: 98%    General Awake, no distress.  HEENT NCAT. PERRL. EOMI. No rhinorrhea.  TMs are clear bilaterally.  Uvula is midline and tonsils are flat.  Mucous membranes are moist.. CV:  Good peripheral perfusion.  RESP:  Normal effort. CTA.  Intermittent cough noted ABD:  No distention.   ED Results / Procedures / Treatments   Labs (all labs ordered are listed, but only abnormal results are displayed) Labs Reviewed  RESP PANEL BY RT-PCR (FLU A&B, COVID) [id number]     EKG  RADIOLOGY  I personally viewed and evaluated these images as part of my medical  decision making, as well as reviewing the written report by the radiologist.  ED Provider Interpretation: No acute findings}  DG Chest 2 View  Result Date: 07/06/2021 CLINICAL DATA:  Productive cough. EXAM: CHEST - 2 VIEW COMPARISON:  08/04/2019 FINDINGS: The lungs are clear without focal pneumonia, edema, pneumothorax or pleural effusion. Cardiopericardial silhouette is at upper limits of normal for size. Left-sided permanent pacemaker noted. The visualized bony structures of the thorax show no acute abnormality. IMPRESSION: No active cardiopulmonary disease. Electronically Signed   By: Misty Stanley M.D.   On: 07/06/2021 12:08     PROCEDURES:  Critical Care performed: No  Procedures   MEDICATIONS ORDERED IN ED: Medications - No data to display   IMPRESSION / MDM / [place] / ED COURSE  I reviewed the triage vital signs and the nursing notes.                              Differential diagnosis includes, but is not limited to, CAP, viral URI, AOM, tonsillitis, laryngitis  Geriatric patient with ED evaluation of 2 to 3 days of intermittent cough, and hoarse voice.  Patient is evaluated for his complaints with a viral panel test and a chest x-ray.  Viral panel test is negative and chest x-ray reveals no acute infectious process based on my review of images.  Patient's diagnosis is consistent with viral URI with cough. Patient will be discharged home with prescriptions for prednisone, Tessalon Perles, albuterol inhaler. Patient is to follow up with his PCP as needed or otherwise directed. Patient is given ED precautions to return to the ED for any worsening or new symptoms.   FINAL CLINICAL IMPRESSION(S) / ED DIAGNOSES   Final diagnoses:  Viral URI with cough     Rx / DC Orders   ED Discharge Orders          Ordered    benzonatate (TESSALON PERLES) 100 MG capsule        07/06/21 1222    albuterol (VENTOLIN HFA) 108 (90 Base) MCG/ACT inhaler  Every 6 hours PRN         07/06/21 1222    predniSONE (DELTASONE) 20 MG tablet  2 times daily with meals        07/06/21 1222             Note:  This document was prepared using Dragon voice recognition software and may include unintentional dictation errors.    Melvenia Needles, PA-C 07/06/21 1244    Lucrezia Starch, MD 07/06/21 1510

## 2021-07-06 NOTE — ED Triage Notes (Signed)
Pt reports sore throat, productive cough with yellow phlem and hoarse voice for the past 2-3 days.

## 2021-07-06 NOTE — Discharge Instructions (Addendum)
Your exam and CXR are normal. Your viral tests is negative for Covid and flu. Take the prescription meds as directed. You may take OTC Delsym cough medicine for additional cough relief. Follow-up with your primary provider as planned.

## 2021-07-07 DIAGNOSIS — Z8249 Family history of ischemic heart disease and other diseases of the circulatory system: Secondary | ICD-10-CM | POA: Insufficient documentation

## 2021-09-19 DIAGNOSIS — E119 Type 2 diabetes mellitus without complications: Secondary | ICD-10-CM | POA: Insufficient documentation

## 2021-10-10 ENCOUNTER — Other Ambulatory Visit: Payer: Self-pay | Admitting: Orthopedic Surgery

## 2021-10-10 DIAGNOSIS — M545 Low back pain, unspecified: Secondary | ICD-10-CM

## 2021-10-10 DIAGNOSIS — M5441 Lumbago with sciatica, right side: Secondary | ICD-10-CM

## 2021-10-10 DIAGNOSIS — M4807 Spinal stenosis, lumbosacral region: Secondary | ICD-10-CM

## 2021-10-10 DIAGNOSIS — M5136 Other intervertebral disc degeneration, lumbar region: Secondary | ICD-10-CM

## 2021-10-15 ENCOUNTER — Other Ambulatory Visit (HOSPITAL_COMMUNITY): Payer: Self-pay | Admitting: Orthopedic Surgery

## 2021-10-15 DIAGNOSIS — M5441 Lumbago with sciatica, right side: Secondary | ICD-10-CM

## 2021-10-15 DIAGNOSIS — M5136 Other intervertebral disc degeneration, lumbar region: Secondary | ICD-10-CM

## 2021-10-15 DIAGNOSIS — M545 Low back pain, unspecified: Secondary | ICD-10-CM

## 2021-10-15 DIAGNOSIS — M4807 Spinal stenosis, lumbosacral region: Secondary | ICD-10-CM

## 2021-11-02 ENCOUNTER — Emergency Department: Payer: Medicare Other

## 2021-11-02 ENCOUNTER — Other Ambulatory Visit: Payer: Self-pay

## 2021-11-02 ENCOUNTER — Emergency Department
Admission: EM | Admit: 2021-11-02 | Discharge: 2021-11-02 | Disposition: A | Discharge status: Home / Self Care | Payer: Medicare Other | Attending: Emergency Medicine | Admitting: Emergency Medicine

## 2021-11-02 DIAGNOSIS — W268XXA Contact with other sharp object(s), not elsewhere classified, initial encounter: Secondary | ICD-10-CM | POA: Diagnosis not present

## 2021-11-02 DIAGNOSIS — Z23 Encounter for immunization: Secondary | ICD-10-CM | POA: Diagnosis not present

## 2021-11-02 DIAGNOSIS — S61211A Laceration without foreign body of left index finger without damage to nail, initial encounter: Secondary | ICD-10-CM | POA: Insufficient documentation

## 2021-11-02 DIAGNOSIS — S6992XA Unspecified injury of left wrist, hand and finger(s), initial encounter: Secondary | ICD-10-CM | POA: Diagnosis present

## 2021-11-02 MED ORDER — SULFAMETHOXAZOLE-TRIMETHOPRIM 800-160 MG PO TABS: 1.0000 | ORAL_TABLET | Freq: Two times a day (BID) | ORAL | 0 refills | Status: DC

## 2021-11-02 MED ORDER — TETANUS-DIPHTH-ACELL PERTUSSIS 5-2.5-18.5 LF-MCG/0.5 IM SUSY
0.5000 mL | PREFILLED_SYRINGE | Freq: Once | INTRAMUSCULAR | Status: AC
  Administered 2021-11-02: 0.5 mL via INTRAMUSCULAR
  Filled 2021-11-02: qty 0.5

## 2021-11-02 NOTE — ED Notes (Signed)
dc ppw provided to patient. Followup and rx information given. pt declines vs at this time and provides verbal consent for dc at this time.

## 2021-11-02 NOTE — ED Provider Notes (Signed)
[facility] Provider Note  Patient Contact: 8:08 PM (approximate)   History   finger laceration   HPI  Ruben Willis is a 76 y.o. male who presents the emergency department for a laceration to the index finger of the left hand.  Patient was using a table saw when he accidentally made contact with the blade.  The blade was spinning but it was slowing down at that time.  He states that he mostly remove the skin from the end of his finger.  He still has good range of motion.  He thinks his tetanus shot was in the last 5 years but is not sure so we will update same.  He has no other injury or complaint at this time.     Physical Exam   Triage Vital Signs: ED Triage Vitals  Enc Vitals Group     BP 11/02/21 1913 (!) 150/92     Pulse Rate 11/02/21 1913 60     Resp 11/02/21 1913 16     Temp 11/02/21 1913 98.1 F (36.7 C)     Temp Source 11/02/21 1913 Oral     SpO2 11/02/21 1913 100 %     Weight 11/02/21 1913 170 lb (77.1 kg)     Height 11/02/21 1913 '5\' 9"'$  (1.753 m)     Head Circumference --      Peak Flow --      Pain Score 11/02/21 1914 5     Pain Loc --      Pain Edu? --      Excl. in [place]? --     Most recent vital signs: Vitals:   11/02/21 1913  BP: (!) 150/92  Pulse: 60  Resp: 16  Temp: 98.1 F (36.7 C)  SpO2: 100%     General: Alert and in no acute distress.   Cardiovascular:  Good peripheral perfusion Respiratory: Normal respiratory effort without tachypnea or retractions. Lungs CTAB.  Musculoskeletal: Full range of motion to all extremities.  Visualization of the left hand reveals an avulsion type injury to the index finger.  There is missing epidermal and dermal tissue with exposed subcutaneous tissue to the distal aspect of the finger pad.  Fingernail is not involved.  No retained foreign body.  Slow ooze bleeding but no active spurting bleeding.  Full range of motion to the digit.  No other visible signs of trauma Neurologic:  No  gross focal neurologic deficits are appreciated.  Skin:   No rash noted Other:   ED Results / Procedures / Treatments   Labs (all labs ordered are listed, but only abnormal results are displayed) Labs Reviewed - No data to display   EKG     RADIOLOGY  I personally viewed, evaluated, and interpreted these images as part of my medical decision making, as well as reviewing the written report by the radiologist.  ED Provider Interpretation: No acute osseous abnormality on x-ray  DG Hand Complete Left  Result Date: 11/02/2021 CLINICAL DATA:  Laceration of the left index finger. EXAM: LEFT HAND - COMPLETE 3+ VIEW COMPARISON:  Left wrist radiograph dated 04/11/2019. FINDINGS: There is no acute fracture or dislocation. The bones are osteopenic. Orthopedic anchors noted in the bases of the first and second metacarpals. There is degenerative changes of the base of the thumb. Dressing noted over the index finger. IMPRESSION: 1. No acute fracture or dislocation. 2. Osteopenia. Electronically Signed   By: Anner Crete M.D.   On: 11/02/2021 21:18  PROCEDURES:  Critical Care performed: No  Procedures   MEDICATIONS ORDERED IN ED: Medications  Tdap (BOOSTRIX) injection 0.5 mL (0.5 mLs Intramuscular Given 11/02/21 2142)     IMPRESSION / MDM / ASSESSMENT AND PLAN / ED COURSE  I reviewed the triage vital signs and the nursing notes.                              Differential diagnosis includes, but is not limited to, finger laceration, finger fracture, open fracture   Patient's presentation is most consistent with acute illness / injury with system symptoms.   Patient's diagnosis is consistent with finger laceration of the index finger.  Patient presented to the ED after sustaining an injury to the index finger from a table saw.  Patient did make contact with the spinning blade that was slowing down at the time.  Patient's bleeding was mostly controlled, Surgicel dressing with tube  gauze applied.  Wound care instructions discussed with the patient at length.  Antibiotics prophylactically.  Imaging revealed no evidence of underlying osseous trauma.  No evidence of retained foreign body.  Follow-up with primary care as needed.  Return precautions discussed with the patient..  Patient is given ED precautions to return to the ED for any worsening or new symptoms.        FINAL CLINICAL IMPRESSION(S) / ED DIAGNOSES   Final diagnoses:  Laceration of left index finger without foreign body without damage to nail, initial encounter     Rx / DC Orders   ED Discharge Orders          Ordered    sulfamethoxazole-trimethoprim (BACTRIM DS) 800-160 MG tablet  2 times daily        11/02/21 2129             Note:  This document was prepared using Dragon voice recognition software and may include unintentional dictation errors.   Brynda Peon 11/02/21 2232    Rada Hay, MD 11/05/21 1800

## 2021-11-02 NOTE — ED Triage Notes (Signed)
Pt reports that he was using table saw and cut tip of left index finger.

## 2021-11-02 NOTE — ED Triage Notes (Signed)
Pt lacerated left index finger on a table saw approx one hour. Pt with controlled bleeding but is currently on eliquis. Pt with small laceration to tip of finger above nail.

## 2021-11-03 DIAGNOSIS — M8589 Other specified disorders of bone density and structure, multiple sites: Secondary | ICD-10-CM | POA: Insufficient documentation

## 2021-12-17 ENCOUNTER — Ambulatory Visit (HOSPITAL_COMMUNITY)
Admission: RE | Admit: 2021-12-17 | Discharge: 2021-12-17 | Disposition: A | Discharge status: Home / Self Care | Payer: Medicare Other | Source: Ambulatory Visit | Attending: Orthopedic Surgery | Admitting: Orthopedic Surgery

## 2021-12-17 DIAGNOSIS — M5136 Other intervertebral disc degeneration, lumbar region: Secondary | ICD-10-CM | POA: Insufficient documentation

## 2021-12-17 DIAGNOSIS — M4807 Spinal stenosis, lumbosacral region: Secondary | ICD-10-CM | POA: Insufficient documentation

## 2021-12-17 DIAGNOSIS — M5441 Lumbago with sciatica, right side: Secondary | ICD-10-CM | POA: Diagnosis present

## 2021-12-17 DIAGNOSIS — M545 Low back pain, unspecified: Secondary | ICD-10-CM | POA: Insufficient documentation

## 2021-12-17 NOTE — Progress Notes (Signed)
Patient here today at [facility] for MRI lumbar spine without contrast. Patient has a St.Jude device and is followed at [facility]. Transmission sent. Orders received for DOO 80. Will re-program once scan is complete.

## 2022-06-12 ENCOUNTER — Ambulatory Visit
Admission: EM | Admit: 2022-06-12 | Discharge: 2022-06-12 | Disposition: A | Discharge status: Home / Self Care | Payer: Medicare Other | Attending: Family Medicine | Admitting: Family Medicine

## 2022-06-12 DIAGNOSIS — U071 COVID-19: Secondary | ICD-10-CM | POA: Diagnosis present

## 2022-06-12 LAB — GROUP A STREP BY PCR: Group A Strep by PCR: NOT DETECTED

## 2022-06-12 LAB — SARS CORONAVIRUS 2 BY RT PCR: SARS Coronavirus 2 by RT PCR: POSITIVE — AB

## 2022-06-12 MED ORDER — PAXLOVID (300/100) 20 X 150 MG & 10 X 100MG PO TBPK: 3.0000 | ORAL_TABLET | Freq: Two times a day (BID) | ORAL | 0 refills | Status: AC

## 2022-06-12 NOTE — Discharge Instructions (Signed)
Stop by the pharmacy to pick up your COVID treatment/Paxlovid.  While taking Paxlovid hold your ezetimibe-simvastatin (cholesterol medicine) and take a half a tablet twice a day of your Eliquis.  You should be at home quarantine until next Monday and then wear a mask until 2 Sundays from now

## 2022-06-12 NOTE — ED Triage Notes (Signed)
Pt c/o sore throat,cough,HA, fever up to 100 & tested + for covid this evening w/home test.

## 2022-06-13 NOTE — ED Provider Notes (Signed)
[facility]    CSN: [id number] Arrival date & time: 06/12/22  1855      History   Chief Complaint Chief Complaint  Patient presents with   Fever   Sore Throat   Cough   Covid Positive    HPI Ruben Willis is a 77 y.o. male.   HPI   [name] presents for after having positive COVID test at home today. Yesterday, he started having fever, sore throat and cough.  No known sick contacts.  Endorses persistent rhinorrhea.  No nasal congestion, vomiting, diarrhea, shortness of breath, chest pain.      Past Medical History:  Diagnosis Date   Arthritis    Cataract, bilateral    Complete heart block (HCC)    s/p PPM placement   ED (erectile dysfunction)    History of BPH    s/p TURP   History of hiatal hernia    surgically repaired   History of obstructive sleep apnea    resolved following sinus surgery   Hypercholesteremia    Hyperlipidemia    Hypertension    Paroxysmal atrial fibrillation (HCC)    Pre-diabetes    Presence of permanent cardiac pacemaker    St.Jude Dual Chamber   Pulmonary embolism ([facility]) 06/09/2000   Sinus node dysfunction (HCC)    Skin cancer    removed from nose   SOB (shortness of breath)     There are no problems to display for this patient.   Past Surgical History:  Procedure Laterality Date   APPENDECTOMY     CARDIAC CATHETERIZATION  08/13/2011   CARPOMETACARPEL SUSPENSION PLASTY Left 10/23/2020   Procedure: LEFT THUMB [place]oe SUSPENSION ARTHROPLASTY;  Surgeon: Corky Mull, MD;  Location: [facility] ORS;  Service: Orthopedics;  Laterality: Left;   CATARACT EXTRACTION, BILATERAL     COLONOSCOPY  2014   COSMETIC SURGERY  05/26/2016   eye lids   HAND SURGERY     HERNIA REPAIR     HIATAL   KNEE SURGERY     NASAL SEPTUM SURGERY     OSTEOTOMY AND ULNAR SHORTENING     PACEMAKER PLACEMENT  08/2018   Dr. Trinna Balloon Bumgarner   PALATE SURGERY     PROSTATE SURGERY     SKIN CANCER EXCISION     SKIN GRAFT     VASECTOMY          Home Medications    Prior to Admission medications   Medication Sig Start Date End Date Taking? Authorizing Provider  acetaminophen (TYLENOL) 500 MG tablet Take 1,000 mg by mouth at bedtime.   Yes [provider]  albuterol (VENTOLIN HFA) 108 (90 Base) MCG/ACT inhaler Inhale 2 puffs into the lungs every 6 (six) hours as needed for shortness of breath. 07/06/21  Yes Menshew, Dannielle Karvonen, PA-C  amLODipine (NORVASC) 5 MG tablet Take 5 mg by mouth daily. 04/19/19  Yes [provider]  apixaban (ELIQUIS) 5 MG TABS tablet Take 5 mg by mouth 2 (two) times daily.   Yes [provider]  benzonatate (TESSALON PERLES) 100 MG capsule Take 1-2 tabs TID prn cough 07/06/21  Yes Menshew, Dannielle Karvonen, PA-C  candesartan (ATACAND) 16 MG tablet Take 16 mg by mouth 2 (two) times daily. 09/13/18  Yes [provider]  carvedilol (COREG) 12.5 MG tablet Take 12.5 mg by mouth 2 (two) times daily.   Yes [provider]  Cholecalciferol 25 MCG (1000 UT) capsule Take 1,000 Units by mouth 2 (two) times  daily.   Yes [provider]  ezetimibe-simvastatin (VYTORIN) 10-20 MG per tablet Take 1 tablet by mouth at bedtime.   Yes [provider]  fluticasone (FLONASE) 50 MCG/ACT nasal spray Place 1 spray into both nostrils daily as needed for allergies or rhinitis.   Yes [provider]  Glucosamine-Chondroitin (OSTEO BI-FLEX REGULAR STRENGTH PO) Take 1 tablet by mouth 2 (two) times daily.   Yes [provider]  Lutein 6 MG CAPS Take 6 mg by mouth daily.   Yes [provider]  Multiple Vitamin (MULTI-VITAMIN) tablet Take 1 tablet by mouth daily.   Yes [provider]  nirmatrelvir & ritonavir (PAXLOVID, 300/100,) 20 x 150 MG & 10 x '100MG'$  TBPK Take 3 tablets by mouth 2 (two) times daily for 5 days. 06/12/22 06/17/22 Yes Navaeh Kehres, DO  oxybutynin (DITROPAN-XL) 10 MG 24 hr tablet Take 10 mg by mouth daily. 06/29/14  Yes  [provider]  sulfamethoxazole-trimethoprim (BACTRIM DS) 800-160 MG tablet Take 1 tablet by mouth 2 (two) times daily. 11/02/21  Yes Cuthriell, Charline Bills, PA-C    Family History Family History  Problem Relation Age of Onset   Diabetes Mother    Heart disease Mother    Aneurysm Father        brain   CAD Father     Social History Social History   Tobacco Use   Smoking status: Never   Smokeless tobacco: Never  Vaping Use   Vaping Use: Never used  Substance Use Topics   Alcohol use: No   Drug use: No     Allergies   Penicillins, Morphine, and Nitroglycerin   Review of Systems Review of Systems: negative unless otherwise stated in HPI.      Physical Exam Triage Vital Signs ED Triage Vitals  Enc Vitals Group     BP 06/12/22 1903 (!) 166/88     Pulse Rate 06/12/22 1903 80     Resp 06/12/22 1903 16     Temp 06/12/22 1903 (!) 100.8 F (38.2 C)     Temp Source 06/12/22 1903 Oral     SpO2 06/12/22 1903 96 %     Weight 06/12/22 1902 182 lb (82.6 kg)     Height 06/12/22 1902 '5\' 9"'$  (1.753 m)     Head Circumference --      Peak Flow --      Pain Score 06/12/22 1902 6     Pain Loc --      Pain Edu? --      Excl. in [place]? --    No data found.  Updated Vital Signs BP (!) 166/88 (BP Location: Left Arm)   Pulse 80   Temp (!) 100.8 F (38.2 C) (Oral)   Resp 16   Ht '5\' 9"'$  (1.753 m)   Wt 82.6 kg   SpO2 96%   BMI 26.88 kg/m   Visual Acuity Right Eye Distance:   Left Eye Distance:   Bilateral Distance:    Right Eye Near:   Left Eye Near:    Bilateral Near:     Physical Exam GEN:     alert, non-toxic appearing male in no distress    HENT:  mucus membranes moist, oropharyngeal without lesions or exudate, no tonsillar hypertrophy,  mild oropharyngeal erythema ,  moderate erythematous edematous turbinates, clear nasal discharge EYES:   pupils equal and reactive, no scleral injection or discharge NECK:  normal ROM RESP:  no increased work of breathing,  clear to  auscultation bilaterally CVS:   regular rate and rhythm Skin:   warm and dry    UC Treatments / Results  Labs (all labs ordered are listed, but only abnormal results are displayed) Labs Reviewed  SARS CORONAVIRUS 2 BY RT PCR - Abnormal; Notable for the following components:      Result Value   SARS Coronavirus 2 by RT PCR POSITIVE (*)    All other components within normal limits  GROUP A STREP BY PCR    EKG   Radiology No results found.  Procedures Procedures (including critical care time)  Medications Ordered in UC Medications - No data to display  Initial Impression / Assessment and Plan / UC Course  I have reviewed the triage vital signs and the nursing notes.  Pertinent labs & imaging results that were available during my care of the patient were reviewed by me and considered in my medical decision making (see chart for details).        Patient is a 77 y.o. male with history of PE (Eliquis)  who presents after testing positive for at home today. Overall he is non-toxic appearing, well-hydrated and in no respiratory distress. He is febrile and hypertensive.  COVID and  influenza testing obtained and he was positive for COVID here.   Discussed symptomatic treatment.  His rhinorrhea bothers him the most.  Pulmonary exam she has equal aeration bilaterally, imaging deferred.  He is interested in treatment for COVID. After shared decision making, he is interested in [place].  Rx sent to pharmacy. Advised to decrease dose of Eliquis and hold lipid lowering medications while taking Paxlovid.  Work note offered and provided if needed.  Isolation and quarantine instructions provided. Typical duration of symptoms discussed. ED and return precautions and understanding voiced. Discussed MDM, treatment plan and plan for follow-up with patient/parent who agrees with plan.      Final Clinical Impressions(s) / UC Diagnoses   Final diagnoses:  [id number]     Discharge  Instructions      Stop by the pharmacy to pick up your COVID treatment/Paxlovid.  While taking Paxlovid hold your ezetimibe-simvastatin (cholesterol medicine) and take a half a tablet twice a day of your Eliquis.  You should be at home quarantine until next Monday and then wear a mask until 2 Sundays from now     ED Prescriptions     Medication Sig Dispense Auth. Provider   nirmatrelvir & ritonavir (PAXLOVID, 300/100,) 20 x 150 MG & 10 x '100MG'$  TBPK Take 3 tablets by mouth 2 (two) times daily for 5 days. 30 tablet Lyndee Hensen, DO      PDMP not reviewed this encounter.   Lyndee Hensen, DO 06/13/22 [phone]

## 2022-06-24 ENCOUNTER — Ambulatory Visit
Admission: EM | Admit: 2022-06-24 | Discharge: 2022-06-24 | Discharge status: Against Medical Advice | Payer: Medicare Other | Attending: Family Medicine | Admitting: Family Medicine

## 2022-06-24 NOTE — ED Provider Notes (Signed)
Pt elected to not be seen.  He tested positive for COVID at home. He was COVID positive on 06/13/22 and given Paxlovid. I suspect he is still shedding virus s/p treatment. Advised quarantine for additional 5 days.  Monitor symptoms and re-test at home.   Lyndee Hensen, DO    [place], Ronnette Juniper, DO 06/24/22 1116

## 2022-07-06 ENCOUNTER — Ambulatory Visit
Admission: EM | Admit: 2022-07-06 | Discharge: 2022-07-06 | Disposition: A | Discharge status: Home / Self Care | Payer: Medicare Other | Attending: Emergency Medicine | Admitting: Emergency Medicine

## 2022-07-06 ENCOUNTER — Encounter: Payer: Self-pay | Admitting: Emergency Medicine

## 2022-07-06 ENCOUNTER — Ambulatory Visit (INDEPENDENT_AMBULATORY_CARE_PROVIDER_SITE_OTHER): Payer: Medicare Other

## 2022-07-06 DIAGNOSIS — S90211A Contusion of right great toe with damage to nail, initial encounter: Secondary | ICD-10-CM

## 2022-07-06 DIAGNOSIS — S90111A Contusion of right great toe without damage to nail, initial encounter: Secondary | ICD-10-CM

## 2022-07-06 IMAGING — CT CT HEAD W/O CM
4 series · 16 of 47 positions shown, 18 images · non-contrast
Comparison: 08/04/2019

CLINICAL DATA: The patient fell and hit the back of his head a
[place] [HOSPITAL] days ago. Headache and sharp neck and shoulder pain
today. Taking Eliquis.



[Series 2: head wo · axial · 0.40mm/px · z∈[-156,-46]mm · 7 of 30 slices shown, 9 images]
[im 4/30  brain]
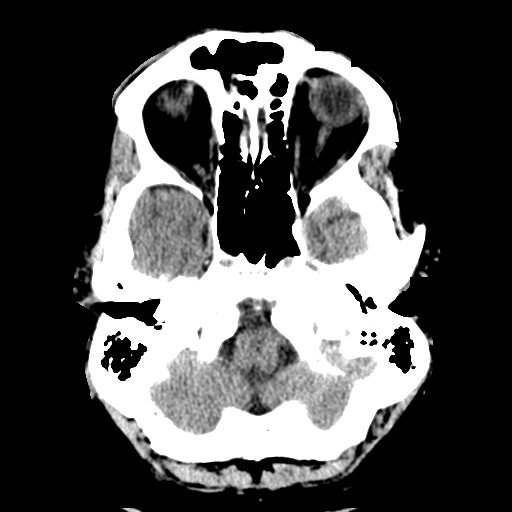
[im 4/30  bone]
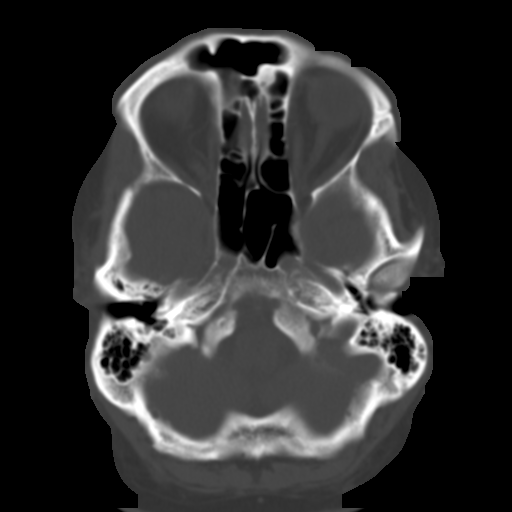
[im 8/30  brain]
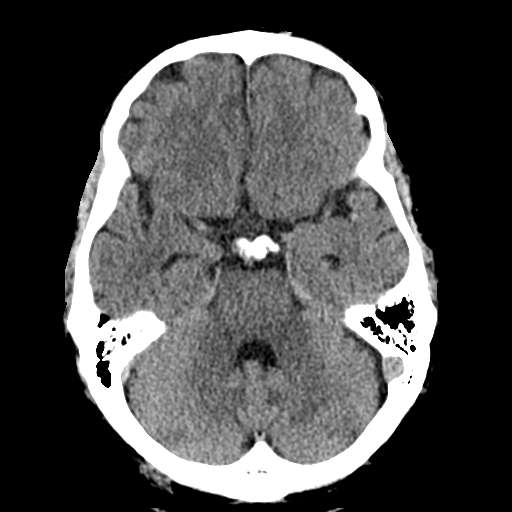
[im 11/30  brain]
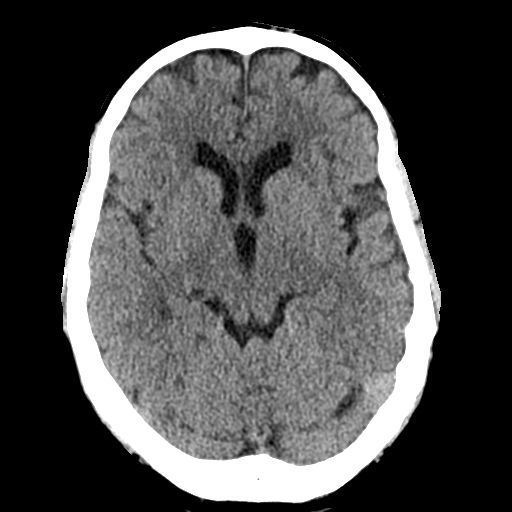
[im 15/30  brain]
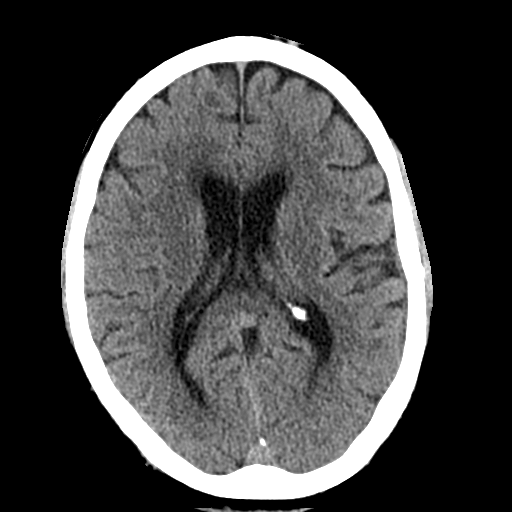
[im 19/30  brain]
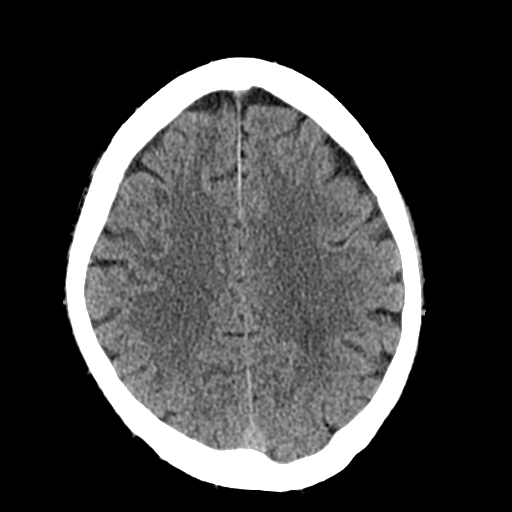
[im 19/30  bone]
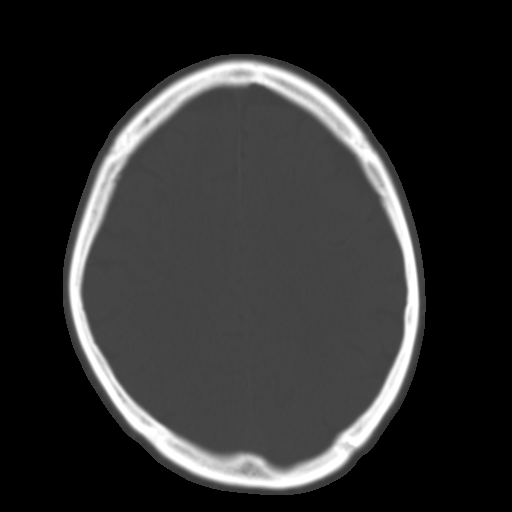
[im 22/30  brain]
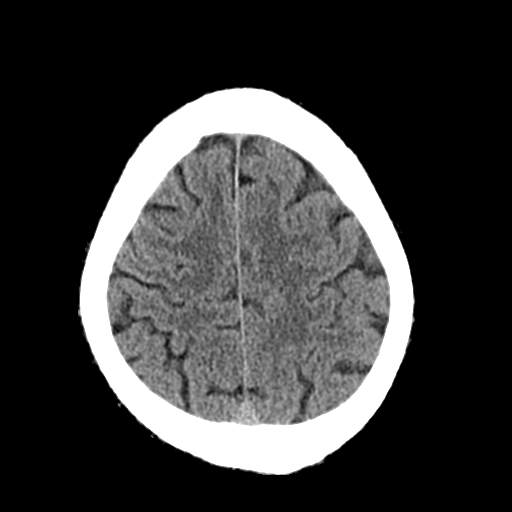
[im 26/30  brain]
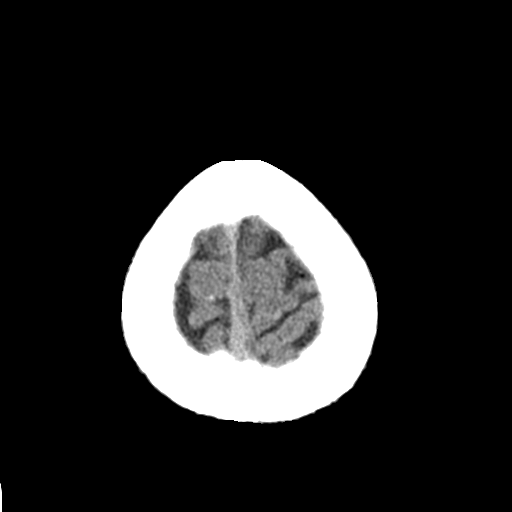

[Series 3: head bone · axial · 0.40mm/px · z∈[-157,-129]mm · 3 of 74 slices shown]
[im 8/74  bone]
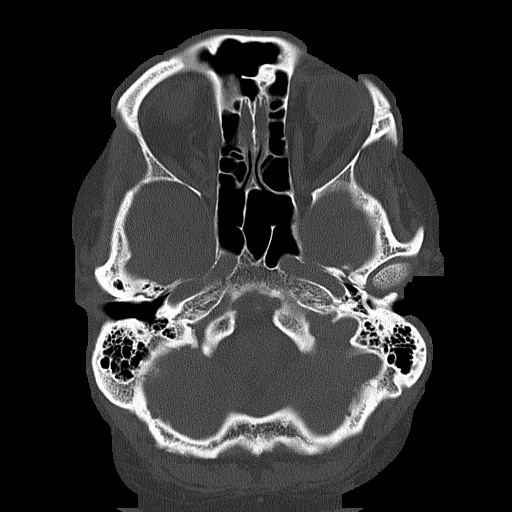
[im 15/74  bone]
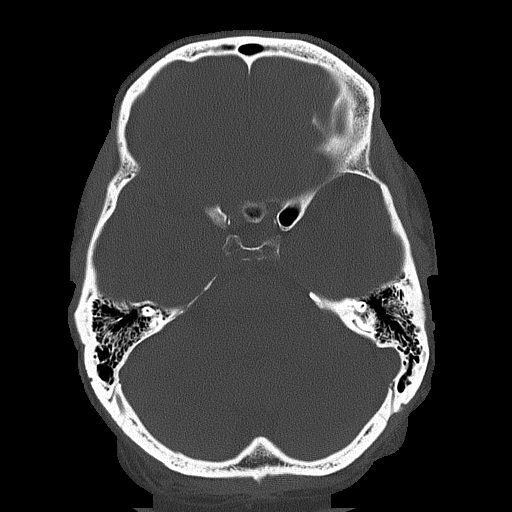
[im 22/74  bone]
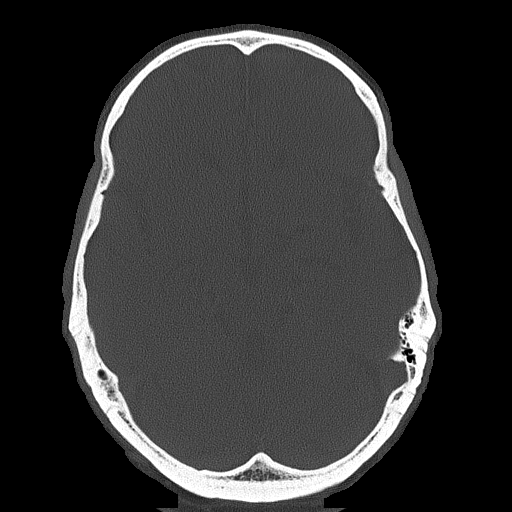

[Series 4: coronal soft tissue · coronal · 0.31mm/px · 3 of 67 slices shown]
[im 23/67  brain]
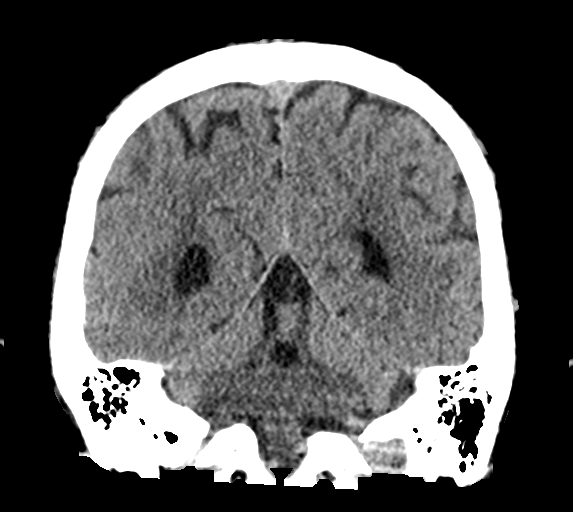
[im 30/67  brain]
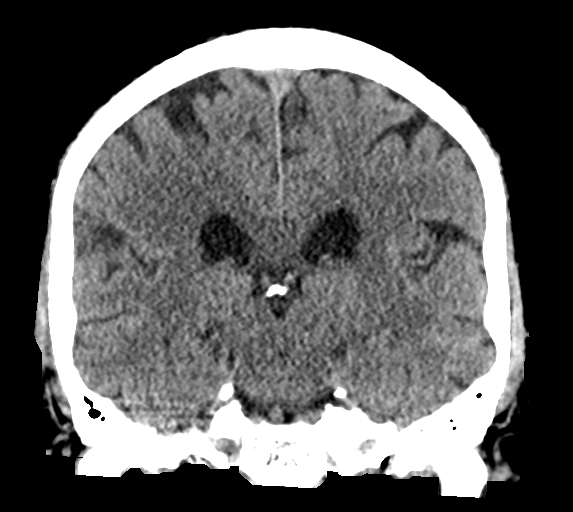
[im 37/67  brain]
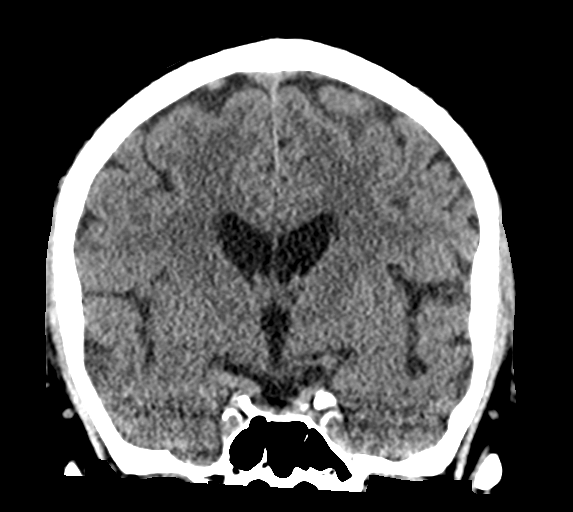

[Series 5: sagittal soft tissue · sagittal · 0.31mm/px · 3 of 60 slices shown]
[im 20/60  brain]
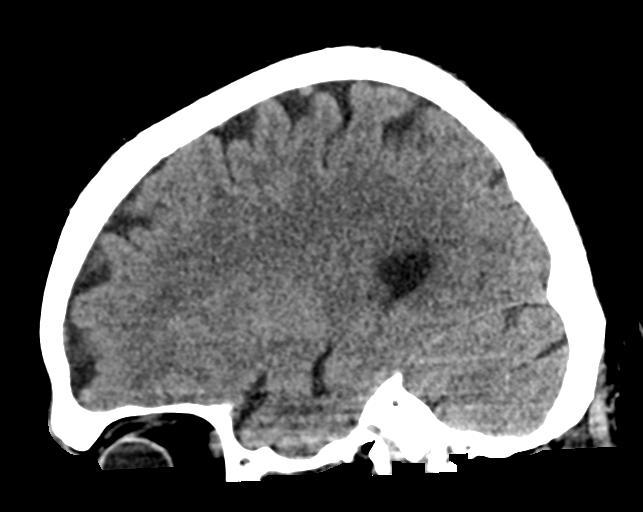
[im 30/60  brain]
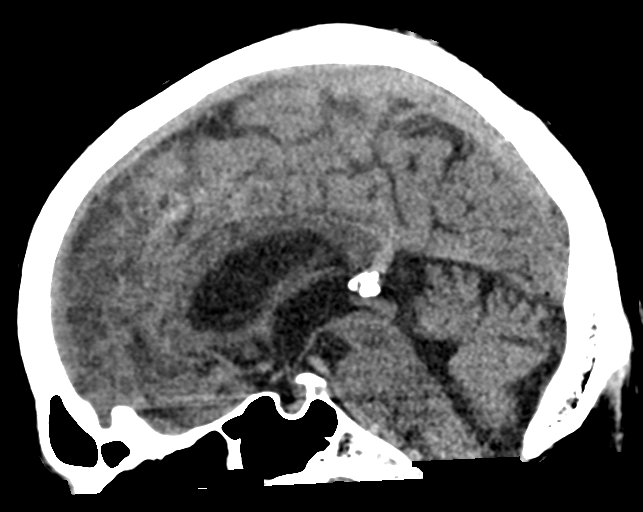
[im 40/60  brain]
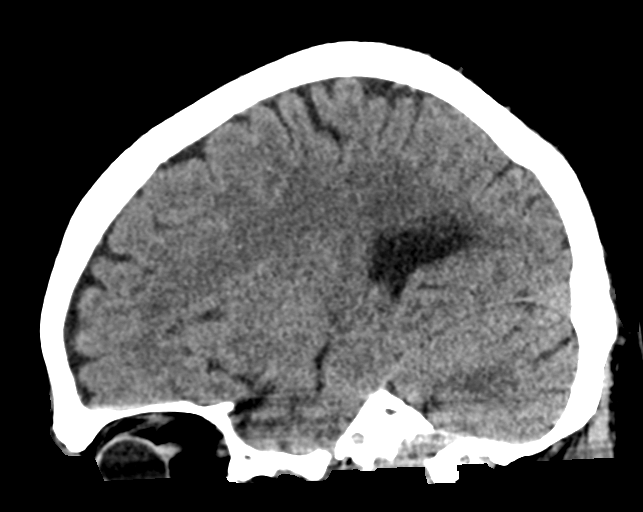

[16 of 47 positions shown; findings below may reference images not displayed]

FINDINGS: CT HEAD FINDINGS

Brain: Stable mildly enlarged ventricles and cortical sulci. Stable
mild patchy white matter low density in both cerebral hemispheres.
No intracranial hemorrhage, mass lesion or CT evidence of acute
infarction.

Vascular: No hyperdense vessel or unexpected calcification.

Skull: Normal. Negative for fracture or focal lesion.

Sinuses/Orbits: Unremarkable.

Other: None.

CT CERVICAL SPINE FINDINGS

Alignment: Stable mild retrolisthesis at the C2-3, C3-4 and C5-6
levels and mild anterolisthesis at the C5-6 and C7-T1 levels.

Skull base and vertebrae: No acute fracture. No primary bone lesion
or focal pathologic process.

Soft tissues and spinal canal: No prevertebral fluid or swelling. No
visible canal hematoma.

Disc levels: Multilevel degenerative changes without significant
change.

Upper chest: Clear lung apices.

Other: Mild bilateral carotid artery calcifications.
IMPRESSION: 1. No skull fracture or intracranial hemorrhage.
2. Cervical spine fracture or traumatic subluxation.
3. Stable mild diffuse cerebral atrophy and minimal small vessel
white matter ischemic changes in both cerebral hemispheres.
4. Stable cervical spine degenerative changes.
5. Mild bilateral carotid artery atheromatous calcifications.

## 2022-07-06 IMAGING — CT CT CERVICAL SPINE W/O CM
3 of 4 series · 12 of 33 positions shown, 14 images · non-contrast
Comparison: 08/04/2019

CLINICAL DATA: The patient fell and hit the back of his head a
[place] [HOSPITAL] days ago. Headache and sharp neck and shoulder pain
today. Taking Eliquis.



[Series 7: coronal bone · coronal · 0.28mm/px · 3 of 51 slices shown]
[im 11/51  bone]
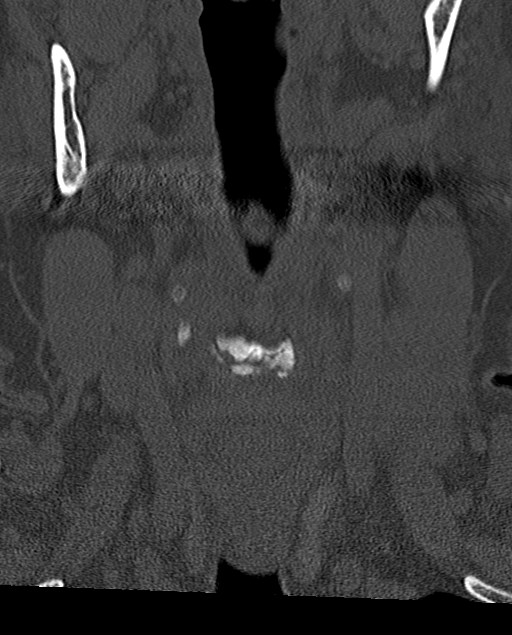
[im 21/51  bone]
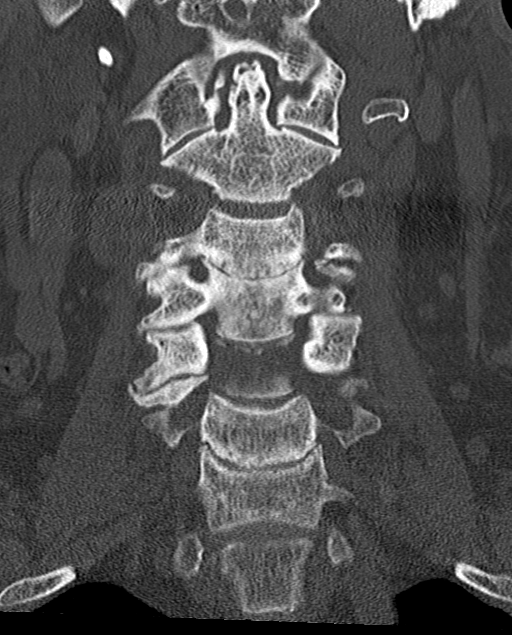
[im 31/51  bone]
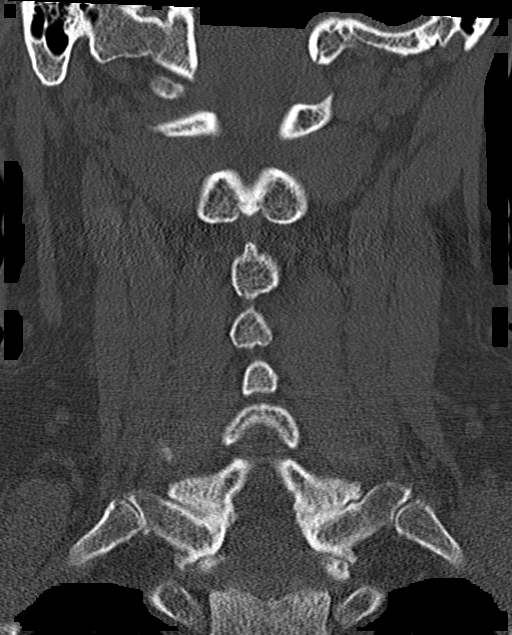

[Series 8: orthogonal bone · axial · 0.23mm/px · z∈[-328,-219]mm · 4 of 88 slices shown, 5 images]
[im 15/88  soft-tissue]
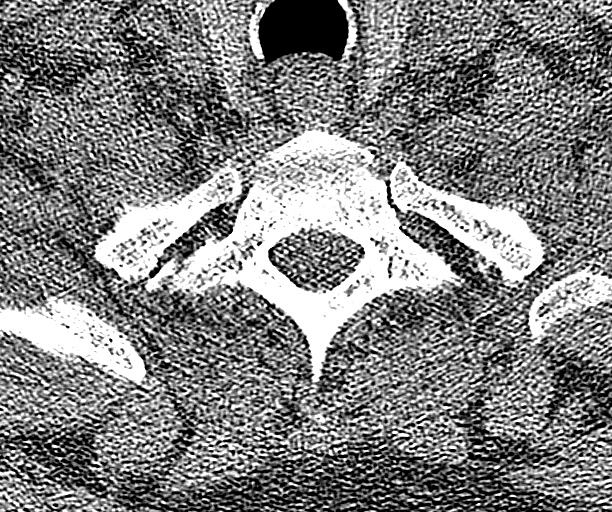
[im 15/88  bone]
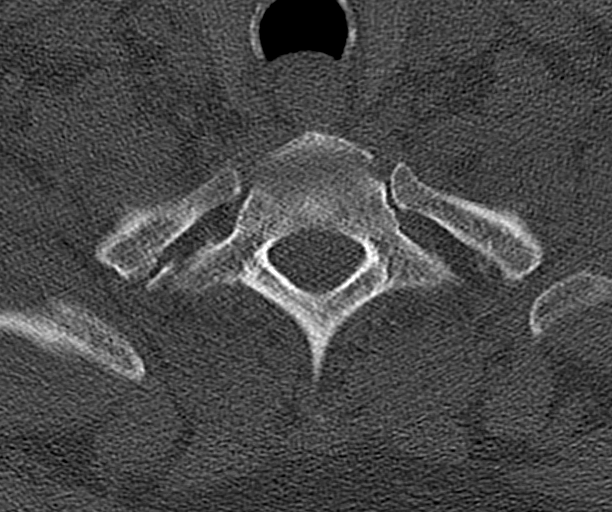
[im 30/88  bone]
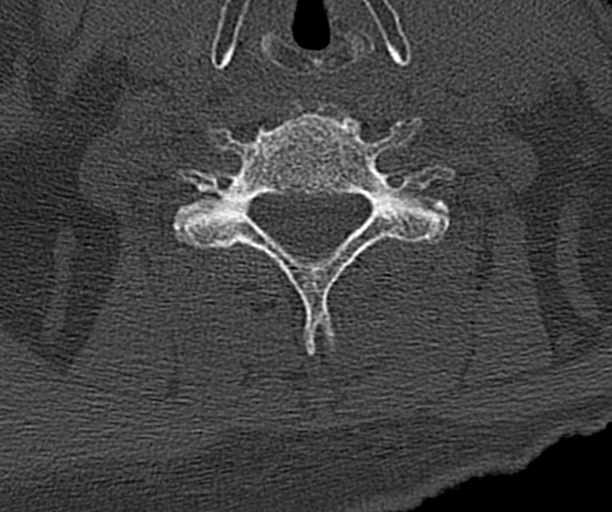
[im 59/88  bone]
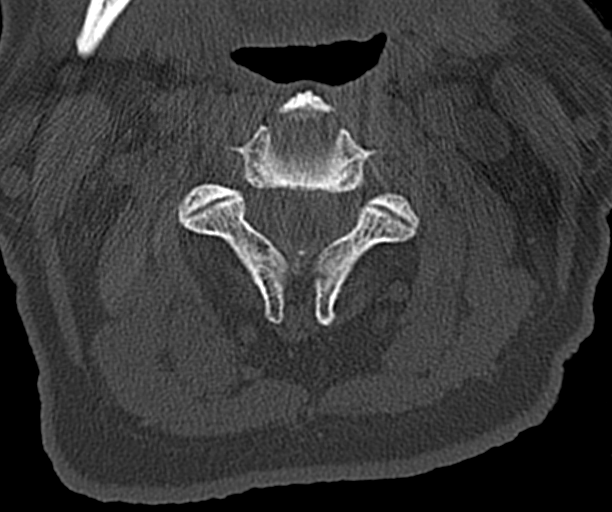
[im 73/88  bone]
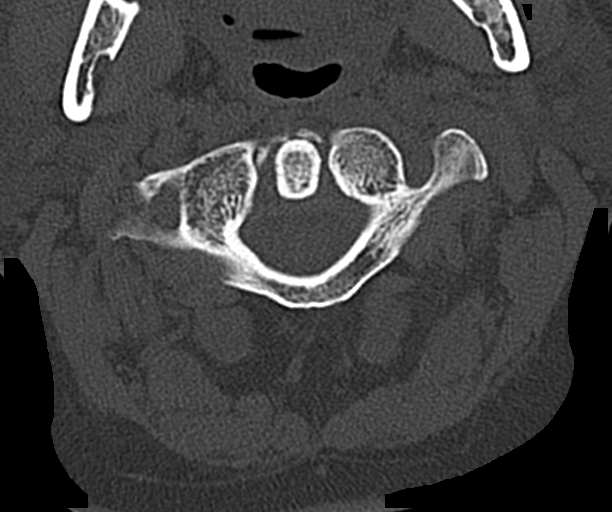

[Series 9: sagittal bone · sagittal · 0.26mm/px · 5 of 71 slices shown, 6 images]
[im 24/71  bone]
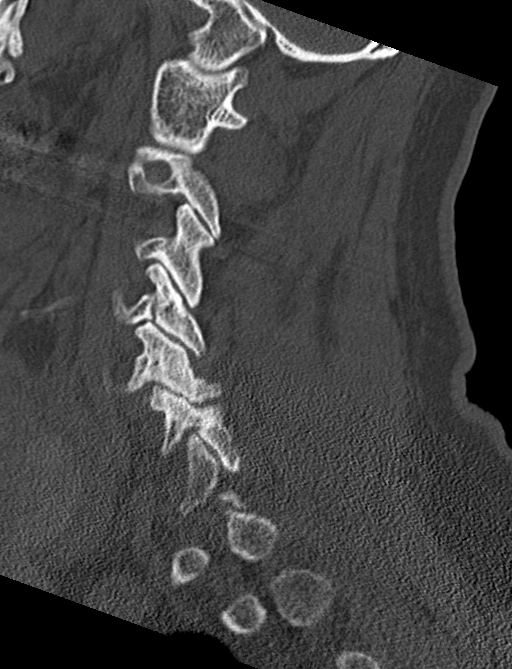
[im 30/71  bone]
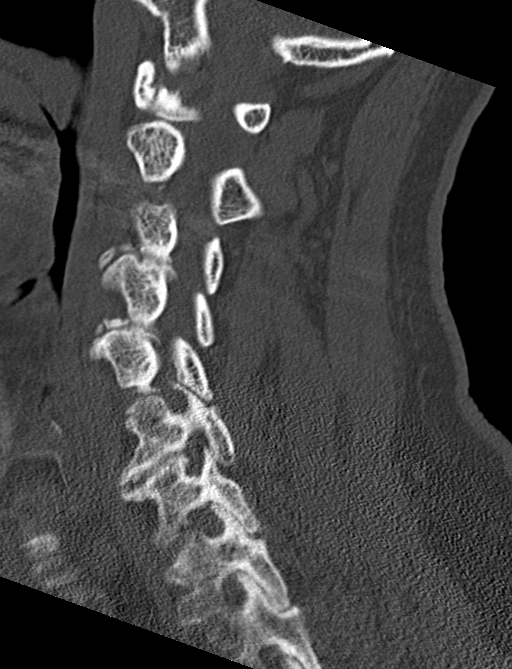
[im 36/71  soft-tissue]
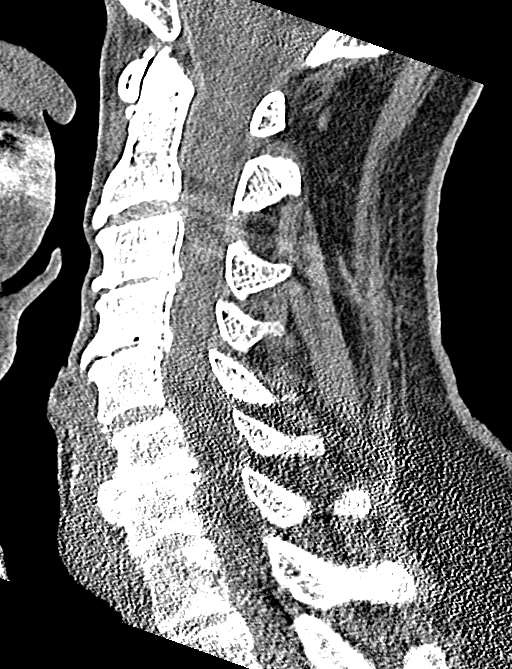
[im 36/71  bone]
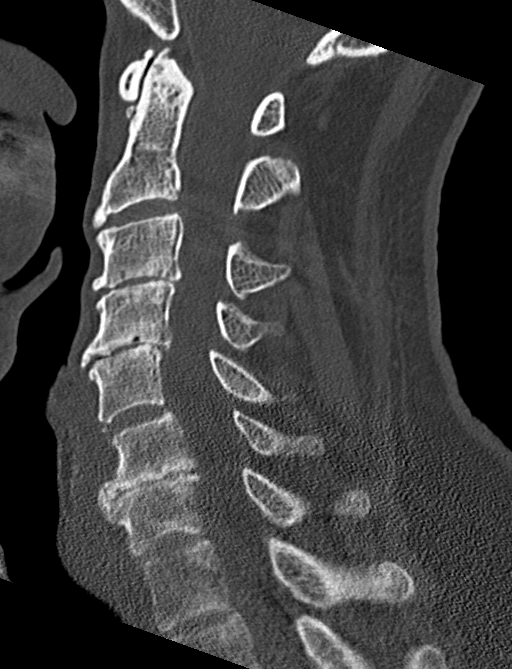
[im 41/71  bone]
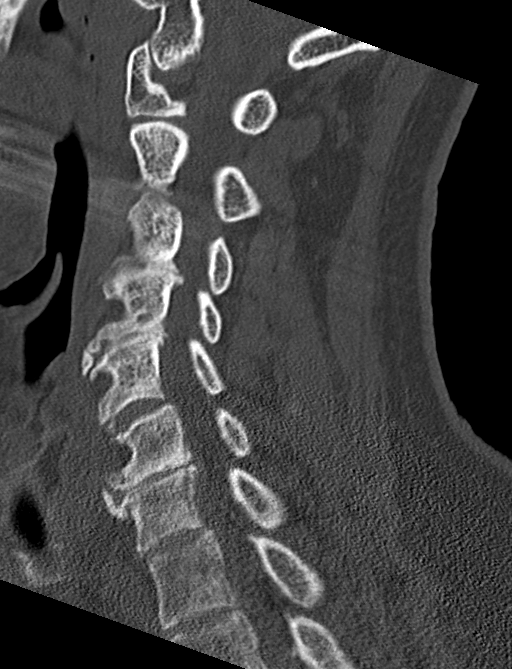
[im 47/71  bone]
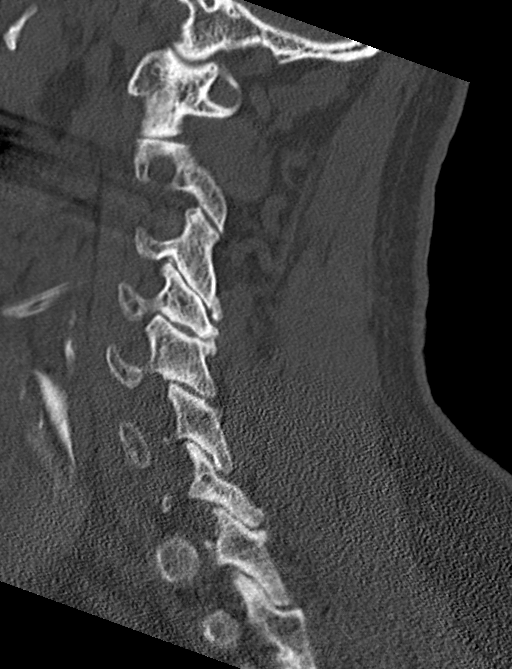

[12 of 33 positions shown; findings below may reference images not displayed]

FINDINGS: CT HEAD FINDINGS

Brain: Stable mildly enlarged ventricles and cortical sulci. Stable
mild patchy white matter low density in both cerebral hemispheres.
No intracranial hemorrhage, mass lesion or CT evidence of acute
infarction.

Vascular: No hyperdense vessel or unexpected calcification.

Skull: Normal. Negative for fracture or focal lesion.

Sinuses/Orbits: Unremarkable.

Other: None.

CT CERVICAL SPINE FINDINGS

Alignment: Stable mild retrolisthesis at the C2-3, C3-4 and C5-6
levels and mild anterolisthesis at the C5-6 and C7-T1 levels.

Skull base and vertebrae: No acute fracture. No primary bone lesion
or focal pathologic process.

Soft tissues and spinal canal: No prevertebral fluid or swelling. No
visible canal hematoma.

Disc levels: Multilevel degenerative changes without significant
change.

Upper chest: Clear lung apices.

Other: Mild bilateral carotid artery calcifications.
IMPRESSION: 1. No skull fracture or intracranial hemorrhage.
2. Cervical spine fracture or traumatic subluxation.
3. Stable mild diffuse cerebral atrophy and minimal small vessel
white matter ischemic changes in both cerebral hemispheres.
4. Stable cervical spine degenerative changes.
5. Mild bilateral carotid artery atheromatous calcifications.

## 2022-07-06 NOTE — ED Triage Notes (Signed)
Pt dropped a table on his right foot. He has right great toe pain.

## 2022-07-06 NOTE — ED Provider Notes (Signed)
[facility]    CSN: [id number] Arrival date & time: 07/06/22  0856      History   Chief Complaint Chief Complaint  Patient presents with   Toe Pain    HPI Ruben Willis is a 77 y.o. male.   HPI  15 old male here for evaluation of right big toe pain.  The patient has significant past medical history to include hypertension, PE, hyperlipidemia, prediabetes, history of complete heart block, high cholesterol, BPH, shortness of breath, and paroxysmal atrial fibrillation who presents for evaluation of right big toe pain after dropping a table on it yesterday.  He has full range of motion and he is ambulating with some discomfort.  He does complain of some mild tingling in the toe but he has sensation.  He came in because he is concerned due to being on a blood thinner and he has bleeding underneath his toenail.  Past Medical History:  Diagnosis Date   Arthritis    Cataract, bilateral    Complete heart block (HCC)    s/p PPM placement   ED (erectile dysfunction)    History of BPH    s/p TURP   History of hiatal hernia    surgically repaired   History of obstructive sleep apnea    resolved following sinus surgery   Hypercholesteremia    Hyperlipidemia    Hypertension    Paroxysmal atrial fibrillation (HCC)    Pre-diabetes    Presence of permanent cardiac pacemaker    St.Jude Dual Chamber   Pulmonary embolism ([place]) 06/09/2000   Sinus node dysfunction (HCC)    Skin cancer    removed from nose   SOB (shortness of breath)     There are no problems to display for this patient.   Past Surgical History:  Procedure Laterality Date   APPENDECTOMY     CARDIAC CATHETERIZATION  08/13/2011   CARPOMETACARPEL SUSPENSION PLASTY Left 10/23/2020   Procedure: LEFT THUMB [place] SUSPENSION ARTHROPLASTY;  Surgeon: Corky Mull, MD;  Location: [facility] ORS;  Service: Orthopedics;  Laterality: Left;   CATARACT EXTRACTION, BILATERAL     COLONOSCOPY  2014   COSMETIC SURGERY   05/26/2016   eye lids   HAND SURGERY     HERNIA REPAIR     HIATAL   KNEE SURGERY     NASAL SEPTUM SURGERY     OSTEOTOMY AND ULNAR SHORTENING     PACEMAKER PLACEMENT  08/2018   Dr. Trinna Balloon Bumgarner   PALATE SURGERY     PROSTATE SURGERY     SKIN CANCER EXCISION     SKIN GRAFT     VASECTOMY         Home Medications    Prior to Admission medications   Medication Sig Start Date End Date Taking? Authorizing Provider  acetaminophen (TYLENOL) 500 MG tablet Take 1,000 mg by mouth at bedtime.    [provider]  albuterol (VENTOLIN HFA) 108 (90 Base) MCG/ACT inhaler Inhale 2 puffs into the lungs every 6 (six) hours as needed for shortness of breath. 07/06/21   Menshew, Dannielle Karvonen, PA-C  amLODipine (NORVASC) 5 MG tablet Take 5 mg by mouth daily. 04/19/19   [provider]  apixaban (ELIQUIS) 5 MG TABS tablet Take 5 mg by mouth 2 (two) times daily.    [provider]  benzonatate (TESSALON PERLES) 100 MG capsule Take 1-2 tabs TID prn cough 07/06/21   Menshew, Dannielle Karvonen, PA-C  candesartan (ATACAND) 16 MG  tablet Take 16 mg by mouth 2 (two) times daily. 09/13/18   [provider]  carvedilol (COREG) 12.5 MG tablet Take 12.5 mg by mouth 2 (two) times daily.    [provider]  Cholecalciferol 25 MCG (1000 UT) capsule Take 1,000 Units by mouth 2 (two) times daily.    [provider]  ezetimibe-simvastatin (VYTORIN) 10-20 MG per tablet Take 1 tablet by mouth at bedtime.    [provider]  fluticasone (FLONASE) 50 MCG/ACT nasal spray Place 1 spray into both nostrils daily as needed for allergies or rhinitis.    [provider]  Glucosamine-Chondroitin (OSTEO BI-FLEX REGULAR STRENGTH PO) Take 1 tablet by mouth 2 (two) times daily.    [provider]  Lutein 6 MG CAPS Take 6 mg by mouth daily.    [provider]  Multiple Vitamin (MULTI-VITAMIN) tablet Take 1 tablet by mouth daily.    [provider]  oxybutynin (DITROPAN-XL) 10 MG 24 hr tablet Take 10 mg by mouth daily. 06/29/14   [provider]  sulfamethoxazole-trimethoprim (BACTRIM DS) 800-160 MG tablet Take 1 tablet by mouth 2 (two) times daily. 11/02/21   Cuthriell, Charline Bills, PA-C    Family History Family History  Problem Relation Age of Onset   Diabetes Mother    Heart disease Mother    Aneurysm Father        brain   CAD Father     Social History Social History   Tobacco Use   Smoking status: Never   Smokeless tobacco: Never  Vaping Use   Vaping Use: Never used  Substance Use Topics   Alcohol use: No   Drug use: No     Allergies   Penicillins, Morphine, and Nitroglycerin   Review of Systems Review of Systems  Constitutional:  Negative for fever.  Musculoskeletal:  Positive for arthralgias and joint swelling.  Skin:  Positive for color change.  Neurological:  Negative for weakness and numbness.     Physical Exam Triage Vital Signs ED Triage Vitals  Enc Vitals Group     BP      Pulse      Resp      Temp      Temp src      SpO2      Weight      Height      Head Circumference      Peak Flow      Pain Score      Pain Loc      Pain Edu?      Excl. in [place]?    No data found.  Updated Vital Signs BP (!) 160/87 (BP Location: Left Arm)   Pulse 60   Temp 98 F (36.7 C)   Resp 16   SpO2 100%   Visual Acuity Right Eye Distance:   Left Eye Distance:   Bilateral Distance:    Right Eye Near:   Left Eye Near:    Bilateral Near:     Physical Exam Vitals and nursing note reviewed.  Constitutional:      Appearance: Normal appearance.  Skin:    General: Skin is warm and dry.     Capillary Refill: Capillary refill takes less than 2 seconds.     Findings: Bruising and erythema present.  Neurological:     General: No focal deficit present.     Mental Status: He is alert and oriented to person, place, and time.  Psychiatric:  Mood and Affect: Mood normal.         Behavior: Behavior normal.        Thought Content: Thought content normal.        Judgment: Judgment normal.      UC Treatments / Results  Labs (all labs ordered are listed, but only abnormal results are displayed) Labs Reviewed - No data to display  EKG   Radiology DG Toe Great Right  Result Date: 07/06/2022 CLINICAL DATA:  Right great toe injury EXAM: RIGHT GREAT TOE COMPARISON:  None Available. FINDINGS: There is no evidence of fracture or dislocation. Mild osteoarthritic changes great toe. Mild soft tissue swelling. Atherosclerotic vascular calcifications. IMPRESSION: Mild soft tissue swelling of the right great toe without acute fracture or dislocation. Electronically Signed   By: Davina Poke D.O.   On: 07/06/2022 10:58    Procedures Procedures (including critical care time)  Medications Ordered in UC Medications - No data to display  Initial Impression / Assessment and Plan / UC Course  I have reviewed the triage vital signs and the nursing notes.  Pertinent labs & imaging results that were available during my care of the patient were reviewed by me and considered in my medical decision making (see chart for details).   Patient is a pleasant, nontoxic-appearing 77 year old male here for evaluation of pain, swelling, and bruising to the right great toe after dropping a table on it yesterday.  Patient is currently on Eliquis and is concerned about the bleeding underneath his toenail.  Patient has erythema around the cuticle as well as deep ecchymosis and a subungual hematoma as evidenced in the image above.  He is tender over the distal phalanx but not over the IP joint, proximal phalanx, or MCP of the right great toe.  I will obtain a radiograph to rule out any bony abnormality.  Impression of right great toe films states that there is soft tissue swelling but no fracture or dislocation.  I will discharge patient home with a diagnosis of contusion of right great toe  along with subungual hematoma.  I have advised him to leave the toenail in place and let it fall off on its own.  He can apply ice to the toe for 20 minutes at a time 2-3 times a day as needed for pain and swelling.  He can also keep it elevated is much as possible.   Final Clinical Impressions(s) / UC Diagnoses   Final diagnoses:  Contusion of right great toe without damage to nail, initial encounter  Subungual hematoma of great toe of right foot, initial encounter     Discharge Instructions      Your x-rays today did not demonstrate any broken bones.  You have bruised your toe and this will heal with time.  Keep your right foot elevated is much as possible to help decrease swelling and aid in pain relief.  You can apply ice to your toe for 20 minutes at a time, 2-3 times a day, as needed for pain and swelling.  Make sure that you have a cloth between the ice and your skin so you do not cause skin damage.  You can take over-the-counter Tylenol according to the package instructions as needed for pain.  Let the toenail fall off on its own.  If you develop any increased redness of your toe, swelling of your toe, red streaks going up your foot, or fever either return for reevaluation or see your primary care provider.  ED Prescriptions   None    PDMP not reviewed this encounter.   Margarette Canada, NP 07/06/22 1106

## 2022-07-06 NOTE — Discharge Instructions (Addendum)
Your x-rays today did not demonstrate any broken bones.  You have bruised your toe and this will heal with time.  Keep your right foot elevated is much as possible to help decrease swelling and aid in pain relief.  You can apply ice to your toe for 20 minutes at a time, 2-3 times a day, as needed for pain and swelling.  Make sure that you have a cloth between the ice and your skin so you do not cause skin damage.  You can take over-the-counter Tylenol according to the package instructions as needed for pain.  Let the toenail fall off on its own.  If you develop any increased redness of your toe, swelling of your toe, red streaks going up your foot, or fever either return for reevaluation or see your primary care provider.

## 2023-11-01 ENCOUNTER — Ambulatory Visit
Admission: EM | Admit: 2023-11-01 | Discharge: 2023-11-01 | Disposition: A | Discharge status: Home / Self Care | Attending: Physician Assistant | Admitting: Physician Assistant

## 2023-11-01 DIAGNOSIS — R897 Abnormal histological findings in specimens from other organs, systems and tissues: Secondary | ICD-10-CM | POA: Diagnosis not present

## 2023-11-01 DIAGNOSIS — R350 Frequency of micturition: Secondary | ICD-10-CM | POA: Insufficient documentation

## 2023-11-01 DIAGNOSIS — N410 Acute prostatitis: Secondary | ICD-10-CM | POA: Diagnosis not present

## 2023-11-01 DIAGNOSIS — N400 Enlarged prostate without lower urinary tract symptoms: Secondary | ICD-10-CM | POA: Diagnosis not present

## 2023-11-01 DIAGNOSIS — I10 Essential (primary) hypertension: Secondary | ICD-10-CM | POA: Insufficient documentation

## 2023-11-01 DIAGNOSIS — J3089 Other allergic rhinitis: Secondary | ICD-10-CM | POA: Insufficient documentation

## 2023-11-01 LAB — URINALYSIS, W/ REFLEX TO CULTURE (INFECTION SUSPECTED)
Bilirubin Urine: NEGATIVE
Glucose, UA: NEGATIVE mg/dL
Leukocytes,Ua: NEGATIVE
Nitrite: NEGATIVE
Protein, ur: 100 mg/dL — AB
Specific Gravity, Urine: >1.03 — ABNORMAL HIGH (ref 1.005–1.030)
WBC, UA: NONE SEEN WBC/hpf (ref 0–5)
pH: 5.5 (ref 5.0–8.0)

## 2023-11-01 MED ORDER — SULFAMETHOXAZOLE-TRIMETHOPRIM 800-160 MG PO TABS: 1.0000 | ORAL_TABLET | Freq: Two times a day (BID) | ORAL | 0 refills | Status: AC

## 2023-11-01 NOTE — ED Provider Notes (Signed)
 [facility]    CSN: [id number] Arrival date & time: 11/01/23  1224      History   Chief Complaint Chief Complaint  Patient presents with   Urinary Frequency    HPI Trevar Boehringer is a 78 y.o. male with history of BPH, atrial fibrillation (on Eliquis), hyperlipidemia, type 2 diabetes, stress and urinary incontinence, anxiety, hypertensions, and heart block (has pacemaker).  He presents today for urinary frequency, urgency, difficulty urinating, bladder pressure, and pain at tip of penis x 2 days. Denies fever, fatigue, abdominal pain, flank pain, hematuria, painful urination, scrotal pain, back pain.   History of abnormal prostate biopsy recently. 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 Biopsy to be repeated in November of this year.  Patient is on finasteride and oxybutynin.   HPI  Past Medical History:  Diagnosis Date   Arthritis    Cataract, bilateral    Complete heart block (HCC)    s/p PPM placement   ED (erectile dysfunction)    History of BPH    s/p TURP   History of hiatal hernia    surgically repaired   History of obstructive sleep apnea    resolved following sinus surgery   Hypercholesteremia    Hyperlipidemia    Hypertension    Paroxysmal atrial fibrillation (HCC)    Pre-diabetes    Presence of permanent cardiac pacemaker    St.Jude Dual Chamber   Pulmonary embolism (HCC) 06/09/2000   Sinus node dysfunction (HCC)    Skin cancer    removed from nose   SOB (shortness of breath)     Patient Active Problem List   Diagnosis Date Noted   Perennial allergic rhinitis 11/01/2023   Osteopenia of multiple sites 11/03/2021   Type 2 diabetes mellitus without complication, without long-term current use of insulin (HCC) 09/19/2021   FHx: cerebral aneurysm 07/07/2021   Vitamin B12 deficiency 05/23/2021   Situational anxiety 05/21/2021   Carpal tunnel syndrome, right 12/04/2020   Anticoagulated 10/15/2020   Primary  osteoarthritis of first carpometacarpal joint of left hand 10/02/2020   Ulnar neuropathy of right upper extremity 10/02/2020   PAC (premature atrial contraction) 09/06/2020   History of PSVT (paroxysmal supraventricular tachycardia) 08/05/2020   Personal history of other malignant neoplasm of skin 04/16/2020   Degenerative disc disease, cervical 11/21/2019   History of complete heart block 11/21/2019   Paroxysmal atrial fibrillation (HCC) 11/21/2019   Hyperlipidemia 11/21/2019   Primary hypertension 11/21/2019   Pacemaker 01/04/2019   Exertional chest pain 08/10/2018   Heart block 08/10/2018   Palpitations 08/10/2018   SOB (shortness of breath) 08/10/2018   High risk medication use 07/20/2018   Brow ptosis 03/17/2016   Dermatochalasis of both upper eyelids 03/17/2016   Mechanical ptosis of eyelid of both eyes 03/17/2016   Encounter for long-term current use of medication 04/10/2015   Vitamin D deficiency 04/10/2015   Mixed stress and urge urinary incontinence 04/10/2014   Urge incontinence 04/10/2014   ED (erectile dysfunction) of organic origin 10/25/2012   Benign prostatic hyperplasia without urinary obstruction 06/24/2011    Past Surgical History:  Procedure Laterality Date   APPENDECTOMY     CARDIAC CATHETERIZATION  08/13/2011   CARPOMETACARPEL SUSPENSION PLASTY Left 10/23/2020   Procedure: LEFT THUMB [facility] SUSPENSION ARTHROPLASTY;  Surgeon: Elner Hahn, MD;  Location: [facility] ORS;  Service: Orthopedics;  Laterality: Left;   CATARACT EXTRACTION, BILATERAL     COLONOSCOPY  2014   COSMETIC SURGERY  05/26/2016   eye lids  HAND SURGERY     HERNIA REPAIR     HIATAL   KNEE SURGERY     NASAL SEPTUM SURGERY     OSTEOTOMY AND ULNAR SHORTENING     PACEMAKER PLACEMENT  08/2018   Dr. Heddy Liverpool Bumgarner   PALATE SURGERY     PROSTATE SURGERY     SKIN CANCER EXCISION     SKIN GRAFT     VASECTOMY         Home Medications    Prior to Admission medications   Medication Sig  Start Date End Date Taking? Authorizing Provider  sulfamethoxazole -trimethoprim  (BACTRIM  DS) 800-160 MG tablet Take 1 tablet by mouth 2 (two) times daily for 10 days. 11/01/23 11/11/23 Yes Nancy Axon B, PA-C  acetaminophen  (TYLENOL ) 500 MG tablet Take 1,000 mg by mouth at bedtime.    [provider]  albuterol  (VENTOLIN  HFA) 108 (90 Base) MCG/ACT inhaler Inhale 2 puffs into the lungs every 6 (six) hours as needed for shortness of breath. 07/06/21   Menshew, Raye Cai, PA-C  amLODipine (NORVASC) 5 MG tablet Take 5 mg by mouth daily. 04/19/19   [provider]  apixaban (ELIQUIS) 5 MG TABS tablet Take 5 mg by mouth 2 (two) times daily.    [provider]  benzonatate  (TESSALON  PERLES) 100 MG capsule Take 1-2 tabs TID prn cough 07/06/21   Menshew, Raye Cai, PA-C  candesartan (ATACAND) 16 MG tablet Take 16 mg by mouth 2 (two) times daily. 09/13/18   [provider]  carvedilol (COREG) 12.5 MG tablet Take 12.5 mg by mouth 2 (two) times daily.    [provider]  Cholecalciferol 25 MCG (1000 UT) capsule Take 1,000 Units by mouth 2 (two) times daily.    [provider]  ezetimibe-simvastatin (VYTORIN) 10-20 MG per tablet Take 1 tablet by mouth at bedtime.    [provider]  fluticasone (FLONASE) 50 MCG/ACT nasal spray Place 1 spray into both nostrils daily as needed for allergies or rhinitis.    [provider]  Glucosamine-Chondroitin (OSTEO BI-FLEX REGULAR STRENGTH PO) Take 1 tablet by mouth 2 (two) times daily.    [provider]  Lutein 6 MG CAPS Take 6 mg by mouth daily.    [provider]  Multiple Vitamin (MULTI-VITAMIN) tablet Take 1 tablet by mouth daily.    [provider]  oxybutynin (DITROPAN-XL) 10 MG 24 hr tablet Take 10 mg by mouth daily. 06/29/14   [provider]    Family History Family History  Problem Relation Age of Onset   Diabetes Mother    Heart disease Mother     Aneurysm Father        brain   CAD Father     Social History Social History   Tobacco Use   Smoking status: Never   Smokeless tobacco: Never  Vaping Use   Vaping status: Never Used  Substance Use Topics   Alcohol use: No   Drug use: No     Allergies   Penicillins, Morphine, and Nitroglycerin   Review of Systems Review of Systems  Constitutional:  Negative for fatigue and fever.  Gastrointestinal:  Negative for abdominal pain, nausea and vomiting.  Genitourinary:  Positive for difficulty urinating, frequency, penile pain and urgency. Negative for dysuria, enuresis, flank pain, genital sores, hematuria, penile discharge, penile swelling, scrotal swelling and testicular pain.  Musculoskeletal:  Negative for arthralgias.  Skin:  Negative for rash.  Neurological:  Negative for weakness.  Physical Exam Triage Vital Signs ED Triage Vitals [11/01/23 1245]  Encounter Vitals Group     BP (!) 173/94     Girls Systolic BP Percentile      Girls Diastolic BP Percentile      Boys Systolic BP Percentile      Boys Diastolic BP Percentile      Pulse Rate 70     Resp 18     Temp 98 F (36.7 C)     Temp Source Oral     SpO2 96 %     Weight      Height      Head Circumference      Peak Flow      Pain Score      Pain Loc      Pain Education      Exclude from Growth Chart    No data found.  Updated Vital Signs BP (!) 154/80 (BP Location: Right Arm)   Pulse 70   Temp 98 F (36.7 C) (Oral)   Resp 18   SpO2 96%    Physical Exam Vitals and nursing note reviewed.  Constitutional:      General: He is not in acute distress.    Appearance: Normal appearance. He is well-developed. He is not ill-appearing.  HENT:     Head: Normocephalic and atraumatic.   Eyes:     General: No scleral icterus.    Conjunctiva/sclera: Conjunctivae normal.    Cardiovascular:     Rate and Rhythm: Normal rate and regular rhythm.  Pulmonary:     Effort: Pulmonary effort is normal. No  respiratory distress.     Breath sounds: Normal breath sounds.  Abdominal:     Palpations: Abdomen is soft.     Tenderness: There is no abdominal tenderness. There is no right CVA tenderness or left CVA tenderness.   Musculoskeletal:     Cervical back: Neck supple.   Skin:    General: Skin is warm and dry.     Capillary Refill: Capillary refill takes less than 2 seconds.   Neurological:     General: No focal deficit present.     Mental Status: He is alert. Mental status is at baseline.     Motor: No weakness.     Gait: Gait normal.   Psychiatric:        Mood and Affect: Mood normal.        Behavior: Behavior normal.      UC Treatments / Results  Labs (all labs ordered are listed, but only abnormal results are displayed) Labs Reviewed  URINALYSIS, W/ REFLEX TO CULTURE (INFECTION SUSPECTED) - Abnormal; Notable for the following components:      Result Value   Specific Gravity, Urine >1.030 (*)    Hgb urine dipstick MODERATE (*)    Ketones, ur TRACE (*)    Protein, ur 100 (*)    Bacteria, UA FEW (*)    All other components within normal limits  URINE CULTURE    EKG   Radiology No results found.  Procedures Procedures (including critical care time)  Medications Ordered in UC Medications - No data to display  Initial Impression / Assessment and Plan / UC Course  I have reviewed the triage vital signs and the nursing notes.  Pertinent labs & imaging results that were available during my care of the patient were reviewed by me and considered in my medical decision making (see chart for details).   78 y/o male with  history of BPH, atrial fibrillation (on Eliquis), hyperlipidemia, type 2 diabetes, stress and urinary incontinence, anxiety, hypertensions, and heart block (has pacemaker)  presents for urinary frequency.   History of abnormal prostate biopsy recently. 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 Biopsy to be repeated in November of  this year. Reviewed urology and heme/onc notes.  UA today shows >1.030 specific gravity, moderate hgb, trace ketones, protein. Not consistent with UTI. Will culture.  Suspect acute prostatitis. Sent Bactrim  DS x 10 days. Advised increasing fluids and rest. Reviewed following up with urology if not improving.   BP elevated 173/94. Re-check 154/80.  Advised to continue amlodipine, candesartan, carvedilol and check blood pressure regularly.  If consistently greater than 140/90 follow-up with PCP.  Final Clinical Impressions(s) / UC Diagnoses   Final diagnoses:  Acute prostatitis  Urinary frequency  Benign prostatic hyperplasia, unspecified whether lower urinary tract symptoms present  Abnormal prostate biopsy  Essential hypertension     Discharge Instructions      - You may have a prostate infection.  I sent antibiotic to the pharmacy.  We did culture the urine so we might change your antibiotic if the culture grows something resistant to this antibiotic but if the culture is negative we will have you continue with the Bactrim  DS.  If symptoms are not improving please follow-up with your urologist.     ED Prescriptions     Medication Sig Dispense Auth. Provider   sulfamethoxazole -trimethoprim  (BACTRIM  DS) 800-160 MG tablet Take 1 tablet by mouth 2 (two) times daily for 10 days. 20 tablet Jermiah Howton B, PA-C      PDMP not reviewed this encounter.   Floydene Hy, PA-C 11/01/23 1337

## 2023-11-01 NOTE — Discharge Instructions (Addendum)
-   You may have a prostate infection.  I sent antibiotic to the pharmacy.  We did culture the urine so we might change your antibiotic if the culture grows something resistant to this antibiotic but if the culture is negative we will have you continue with the Bactrim  DS.  If symptoms are not improving please follow-up with your urologist.

## 2023-11-01 NOTE — ED Triage Notes (Signed)
 Patient presents to UC for urinary freq dx 2 days. States when voiding he then gets the urge to have a BM. Hx of prostate issues.

## 2023-11-02 LAB — URINE CULTURE: Culture: <10000 — AB

## 2023-11-03 ENCOUNTER — Ambulatory Visit (HOSPITAL_COMMUNITY): Payer: Self-pay
